# Patient Record
Sex: Female | Born: 1935 | Race: White | Hispanic: No | Smoking: Former smoker
Health system: Southern US, Community
[De-identification: ages and names within clinical notes are randomized; demographics above are authoritative.]

## PROBLEM LIST (undated history)

## (undated) DIAGNOSIS — Z9289 Personal history of other medical treatment: Secondary | ICD-10-CM

## (undated) DIAGNOSIS — M810 Age-related osteoporosis without current pathological fracture: Secondary | ICD-10-CM

## (undated) HISTORY — DX: Age-related osteoporosis without current pathological fracture: M81.0

## (undated) HISTORY — PX: HERNIA REPAIR: SHX51

## (undated) HISTORY — DX: Personal history of other medical treatment: Z92.89

## (undated) HISTORY — PX: CATARACT EXTRACTION: SUR2

## (undated) HISTORY — PX: APPENDECTOMY: SHX54

---

## 2014-10-14 LAB — BASIC METABOLIC PANEL
BUN: 20 (ref 4–21)
CO2: 26 — AB (ref 13–22)
Chloride: 102 (ref 99–108)
Creatinine: 0.9 (ref 0.5–1.1)
Glucose: 92
Potassium: 4.6 (ref 3.4–5.3)
Sodium: 141 (ref 137–147)

## 2014-10-14 LAB — HEPATIC FUNCTION PANEL
ALT: 13 (ref 7–35)
AST: 21 (ref 13–35)
Alkaline Phosphatase: 61 (ref 25–125)
Bilirubin, Direct: 0.1 (ref 0.01–0.4)

## 2014-10-14 LAB — CBC AND DIFFERENTIAL
HCT: 37 (ref 36–46)
Hemoglobin: 12.1 (ref 12.0–16.0)
Platelets: 214 (ref 150–399)
WBC: 4.4

## 2014-10-14 LAB — COMPREHENSIVE METABOLIC PANEL
Albumin: 4.8 (ref 3.5–5.0)
Calcium: 9.9 (ref 8.7–10.7)
GFR calc Af Amer: 59
GFR calc non Af Amer: 68
Globulin: 2.3

## 2014-10-14 LAB — CBC: RBC: 4.08 (ref 3.87–5.11)

## 2014-10-14 LAB — LIPID PANEL
HDL: 94 — AB (ref 35–70)
LDL Cholesterol: 92
Triglycerides: 47 (ref 40–160)

## 2015-09-27 LAB — COMPREHENSIVE METABOLIC PANEL
Albumin: 4.7 (ref 3.5–5.0)
Calcium: 10.1 (ref 8.7–10.7)
GFR calc Af Amer: 61
GFR calc non Af Amer: 53
Globulin: 2.4

## 2015-09-27 LAB — CBC AND DIFFERENTIAL
HCT: 37 (ref 36–46)
Hemoglobin: 12.5 (ref 12.0–16.0)
Platelets: 223 (ref 150–399)
WBC: 4.6

## 2015-09-27 LAB — LIPID PANEL
Cholesterol: 194 (ref 0–200)
HDL: 92 — AB (ref 35–70)
LDL Cholesterol: 89
Triglycerides: 64 (ref 40–160)

## 2015-09-27 LAB — BASIC METABOLIC PANEL
BUN: 24 — AB (ref 4–21)
CO2: 29 — AB (ref 13–22)
Chloride: 103 (ref 99–108)
Creatinine: 1 (ref 0.5–1.1)
Glucose: 86
Potassium: 4.4 (ref 3.4–5.3)
Sodium: 141 (ref 137–147)

## 2015-09-27 LAB — HEPATIC FUNCTION PANEL
ALT: 15 (ref 7–35)
AST: 23 (ref 13–35)
Alkaline Phosphatase: 70 (ref 25–125)
Bilirubin, Direct: 0.1 (ref 0.01–0.4)
Bilirubin, Total: 0.5

## 2015-09-27 LAB — CBC: RBC: 4.18 (ref 3.87–5.11)

## 2016-10-09 LAB — BASIC METABOLIC PANEL
BUN: 19 (ref 4–21)
CO2: 26 — AB (ref 13–22)
Chloride: 96 — AB (ref 99–108)
Creatinine: 1.1 (ref 0.5–1.1)
Glucose: 35
Potassium: 4 (ref 3.4–5.3)
Sodium: 139 (ref 137–147)

## 2016-10-09 LAB — LIPID PANEL
Cholesterol: 210 — AB (ref 0–200)
HDL: 106 — AB (ref 35–70)
LDL Cholesterol: 88
Triglycerides: 72 (ref 40–160)

## 2016-10-09 LAB — COMPREHENSIVE METABOLIC PANEL
Albumin: 4.9 (ref 3.5–5.0)
Calcium: 9.5 (ref 8.7–10.7)
GFR calc Af Amer: 55
GFR calc non Af Amer: 47
Globulin: 2.8

## 2016-10-09 LAB — HEPATIC FUNCTION PANEL
ALT: 12 (ref 7–35)
AST: 20 (ref 13–35)
Alkaline Phosphatase: 64 (ref 25–125)
Bilirubin, Direct: 0.2 (ref 0.01–0.4)
Bilirubin, Total: 0.7

## 2017-09-27 LAB — COMPREHENSIVE METABOLIC PANEL
Albumin: 4.5 (ref 3.5–5.0)
Calcium: 10.3 (ref 8.7–10.7)
Globulin: 4

## 2017-09-27 LAB — HEPATIC FUNCTION PANEL
ALT: 15 (ref 7–35)
AST: 28 (ref 13–35)
Alkaline Phosphatase: 62 (ref 25–125)
Bilirubin, Direct: 0.2 (ref 0.01–0.4)
Bilirubin, Total: 0.4

## 2017-09-27 LAB — LIPID PANEL
Cholesterol: 194 (ref 0–200)
HDL: 93 — AB (ref 35–70)
LDL Cholesterol: 85
Triglycerides: 78 (ref 40–160)

## 2017-09-27 LAB — BASIC METABOLIC PANEL
BUN: 24 — AB (ref 4–21)
CO2: 24 — AB (ref 13–22)
Chloride: 103 (ref 99–108)
Creatinine: 1 (ref 0.5–1.1)
Glucose: 105
Potassium: 4.6 (ref 3.4–5.3)
Sodium: 139 (ref 137–147)

## 2018-03-15 LAB — LIPID PANEL
Cholesterol: 204 — AB (ref 0–200)
HDL: 84 — AB (ref 35–70)
LDL Cholesterol: 95
Triglycerides: 123 (ref 40–160)

## 2018-03-15 LAB — COMPREHENSIVE METABOLIC PANEL
Albumin: 5 (ref 3.5–5.0)
Calcium: 10.9 — AB (ref 8.7–10.7)
Globulin: 3

## 2018-03-15 LAB — BASIC METABOLIC PANEL
BUN: 28 — AB (ref 4–21)
CO2: 27 — AB (ref 13–22)
Chloride: 102 (ref 99–108)
Creatinine: 1.2 — AB (ref 0.5–1.1)
Glucose: 105
Potassium: 4.3 (ref 3.4–5.3)
Sodium: 140 (ref 137–147)

## 2018-03-15 LAB — HEPATIC FUNCTION PANEL
ALT: 12 (ref 7–35)
AST: 22 (ref 13–35)
Alkaline Phosphatase: 79 (ref 25–125)
Bilirubin, Direct: 0.2 (ref 0.01–0.4)
Bilirubin, Total: 0.4

## 2019-08-12 ENCOUNTER — Ambulatory Visit: Payer: Medicare Other | Attending: Internal Medicine

## 2019-08-12 DIAGNOSIS — Z23 Encounter for immunization: Secondary | ICD-10-CM

## 2019-08-12 NOTE — Progress Notes (Signed)
   Covid-19 Vaccination Clinic  Name:  Tyleigh Mahn    MRN: [id number] DOB: [birth year]  08/12/2019  Ms. Ruddy was observed post Covid-19 immunization for 15 minutes without incidence. She was provided with Vaccine Information Sheet and instruction to access the V-Safe system.   Ms. Jankovich was instructed to call 911 with any severe reactions post vaccine: Marland Kitchen Difficulty breathing  . Swelling of your face and throat  . A fast heartbeat  . A bad rash all over your body  . Dizziness and weakness    Immunizations Administered    Name Date Dose VIS Date Route   Pfizer COVID-19 Vaccine 08/12/2019  2:56 PM 0.3 mL 06/01/2019 Intramuscular   Manufacturer: [employer], [employer]   Lot: [id number]   NDC: [address]-3

## 2019-09-05 ENCOUNTER — Ambulatory Visit: Payer: Medicare Other | Attending: Internal Medicine

## 2019-09-05 DIAGNOSIS — Z23 Encounter for immunization: Secondary | ICD-10-CM

## 2019-09-05 NOTE — Progress Notes (Signed)
   Covid-19 Vaccination Clinic  Name:  Anna Ward    MRN: [id number] DOB: 1932-03-29  09/05/2019  Anna Ward was observed post Covid-19 immunization for 30 minutes based on pre-vaccination screening without incident. She was provided with Vaccine Information Sheet and instruction to access the V-Safe system.   Anna Ward was instructed to call 911 with any severe reactions post vaccine: Marland Kitchen Difficulty breathing  . Swelling of face and throat  . A fast heartbeat  . A bad rash all over body  . Dizziness and weakness   Immunizations Administered    Name Date Dose VIS Date Route   Pfizer COVID-19 Vaccine 09/05/2019 11:26 AM 0.3 mL 06/01/2019 Intramuscular   Manufacturer: [employer], [employer]   Lot: [id number]   NDC: [address]-4

## 2020-07-01 ENCOUNTER — Ambulatory Visit: Payer: Medicare Other | Attending: Internal Medicine

## 2020-07-01 DIAGNOSIS — Z23 Encounter for immunization: Secondary | ICD-10-CM

## 2020-07-01 NOTE — Progress Notes (Signed)
   Covid-19 Vaccination Clinic  Name:  [name]    MRN: [id number] DOB: 1931-08-26  07/01/2020  [name] was observed post Covid-19 immunization for 15 minutes without incident. She was provided with Vaccine Information Sheet and instruction to access the V-Safe system.   [name] was instructed to call 911 with any severe reactions post vaccine: Marland Kitchen Difficulty breathing  . Swelling of face and throat  . A fast heartbeat  . A bad rash all over body  . Dizziness and weakness   Immunizations Administered    Name Date Dose VIS Date Route   Pfizer COVID-19 Vaccine 07/01/2020  2:33 PM 0.3 mL 04/09/2020 Intramuscular   Manufacturer: [employer], [employer]   Lot: [id number]   NDC: [address]-3

## 2021-02-10 ENCOUNTER — Emergency Department (HOSPITAL_COMMUNITY): Payer: Medicare Other

## 2021-02-10 ENCOUNTER — Other Ambulatory Visit: Payer: Self-pay

## 2021-02-10 ENCOUNTER — Encounter (HOSPITAL_COMMUNITY): Payer: Self-pay | Admitting: Emergency Medicine

## 2021-02-10 ENCOUNTER — Emergency Department (HOSPITAL_COMMUNITY)
Admission: EM | Admit: 2021-02-10 | Discharge: 2021-02-10 | Disposition: A | Discharge status: Home / Self Care | Payer: Medicare Other | Attending: Emergency Medicine | Admitting: Emergency Medicine

## 2021-02-10 DIAGNOSIS — M545 Low back pain, unspecified: Secondary | ICD-10-CM

## 2021-02-10 DIAGNOSIS — S32010A Wedge compression fracture of first lumbar vertebra, initial encounter for closed fracture: Secondary | ICD-10-CM | POA: Diagnosis not present

## 2021-02-10 DIAGNOSIS — N200 Calculus of kidney: Secondary | ICD-10-CM | POA: Diagnosis not present

## 2021-02-10 DIAGNOSIS — S3210XA Unspecified fracture of sacrum, initial encounter for closed fracture: Secondary | ICD-10-CM | POA: Insufficient documentation

## 2021-02-10 DIAGNOSIS — W1839XA Other fall on same level, initial encounter: Secondary | ICD-10-CM | POA: Diagnosis not present

## 2021-02-10 DIAGNOSIS — Y9289 Other specified places as the place of occurrence of the external cause: Secondary | ICD-10-CM | POA: Diagnosis not present

## 2021-02-10 LAB — CBC WITH DIFFERENTIAL/PLATELET
Abs Immature Granulocytes: 0.02 10*3/uL (ref 0.00–0.07)
Basophils Absolute: 0 10*3/uL (ref 0.0–0.1)
Basophils Relative: 1 %
Eosinophils Absolute: 0.2 10*3/uL (ref 0.0–0.5)
Eosinophils Relative: 2 %
HCT: 32.5 % — ABNORMAL LOW (ref 36.0–46.0)
Hemoglobin: 10.7 g/dL — ABNORMAL LOW (ref 12.0–15.0)
Immature Granulocytes: 0 %
Lymphocytes Relative: 8 %
Lymphs Abs: 0.6 10*3/uL — ABNORMAL LOW (ref 0.7–4.0)
MCH: 29.8 pg (ref 26.0–34.0)
MCHC: 32.9 g/dL (ref 30.0–36.0)
MCV: 90.5 fL (ref 80.0–100.0)
Monocytes Absolute: 0.6 10*3/uL (ref 0.1–1.0)
Monocytes Relative: 8 %
Neutro Abs: 6 10*3/uL (ref 1.7–7.7)
Neutrophils Relative %: 81 %
Platelets: 295 10*3/uL (ref 150–400)
RBC: 3.59 MIL/uL — ABNORMAL LOW (ref 3.87–5.11)
RDW: 12.9 % (ref 11.5–15.5)
WBC: 7.4 10*3/uL (ref 4.0–10.5)
nRBC: 0 % (ref 0.0–0.2)

## 2021-02-10 LAB — BASIC METABOLIC PANEL
Anion gap: 11 (ref 5–15)
BUN: 30 mg/dL — ABNORMAL HIGH (ref 8–23)
CO2: 24 mmol/L (ref 22–32)
Calcium: 9.7 mg/dL (ref 8.9–10.3)
Chloride: 100 mmol/L (ref 98–111)
Creatinine, Ser: 1.4 mg/dL — ABNORMAL HIGH (ref 0.44–1.00)
GFR, Estimated: 37 mL/min — ABNORMAL LOW (ref >60)
Glucose, Bld: 116 mg/dL — ABNORMAL HIGH (ref 70–99)
Potassium: 5.1 mmol/L (ref 3.5–5.1)
Sodium: 135 mmol/L (ref 135–145)

## 2021-02-10 LAB — TROPONIN I (HIGH SENSITIVITY): Troponin I (High Sensitivity): 3 ng/L (ref <18)

## 2021-02-10 MED ORDER — MORPHINE SULFATE 15 MG PO TABS: 7.5000 mg | ORAL_TABLET | ORAL | 0 refills | Status: DC | PRN

## 2021-02-10 MED ORDER — DICLOFENAC SODIUM 1 % EX GEL: 4.0000 g | Freq: Four times a day (QID) | CUTANEOUS | 0 refills | Status: DC

## 2021-02-10 MED ORDER — ACETAMINOPHEN 500 MG PO TABS
1000.0000 mg | ORAL_TABLET | Freq: Once | ORAL | Status: AC
  Administered 2021-02-10: 1000 mg via ORAL
  Filled 2021-02-10: qty 2

## 2021-02-10 MED ORDER — OXYCODONE HCL 5 MG PO TABS
2.5000 mg | ORAL_TABLET | Freq: Once | ORAL | Status: AC
  Administered 2021-02-10: 2.5 mg via ORAL
  Filled 2021-02-10: qty 1

## 2021-02-10 NOTE — ED Provider Notes (Signed)
[facility]-EMERGENCY DEPT Provider Note   CSN: [id number] Arrival date & time: 02/10/21  3299     History Chief Complaint  Patient presents with   back pain    Anna Ward is a 87 y.o. female.  87 yo F with a chief complaints of low back pain.  This been going on for a couple days now.  The patient had a fall a couple weeks ago but did not experience any back pain until a couple days ago.  She denies radiation down the legs denies loss of bowel or bladder denies loss of peritoneal sensation.  She has had problems with her low back previously.  She denies any urinary symptoms denies abdominal pain.  Denies nausea vomiting or diarrhea.  Has been able to eat and drink without issue.  Couple weeks ago when she fell she said she was standing waiting to get on the elevator and she suddenly felt lightheaded and collapsed to the ground.  She thinks that she blacked out.  At that time she does think that she struck her head was not had any headaches confusion or vomiting.  Since then has not had any confusion.  Has not had any dizziness.  The history is provided by the patient.  Back Pain Location:  Lumbar spine Quality:  Aching Radiates to:  Does not radiate Pain severity:  Moderate Pain is:  Worse during the day Onset quality:  Sudden Duration:  2 days Timing:  Constant Progression:  Worsening Chronicity:  New Relieved by:  Nothing Worsened by:  Nothing Ineffective treatments:  None tried Associated symptoms: chest pain   Associated symptoms: no dysuria, no fever and no headaches       History reviewed. No pertinent past medical history.  There are no problems to display for this patient.   History reviewed. No pertinent surgical history.   OB History   No obstetric history on file.     No family history on file.     Home Medications Prior to Admission medications   Medication Sig Start Date End Date Taking? Authorizing Provider  acetaminophen  (TYLENOL) 500 MG tablet Take 1,000 mg by mouth every 6 (six) hours as needed for mild pain.   Yes [provider]  Ascorbic Acid (VITAMIN C PO) Take 1 tablet by mouth daily.   Yes [provider]  CALCIUM PO Take 1 tablet by mouth daily.   Yes [provider]  Carboxymethylcellulose Sodium (EYE DROPS OP) Place 1 drop into both eyes daily as needed (dry eyes).   Yes [provider]  diclofenac Sodium (VOLTAREN) 1 % GEL Apply 4 g topically 4 (four) times daily. 02/10/21  Yes Melene Plan, DO  Ibuprofen-diphenhydrAMINE Cit (ADVIL PM) 200-38 MG TABS Take 1 tablet by mouth at bedtime as needed (sleep).   Yes [provider]  morphine (MSIR) 15 MG tablet Take 0.5 tablets (7.5 mg total) by mouth every 4 (four) hours as needed for severe pain. 02/10/21  Yes Melene Plan, DO  Multiple Vitamin (MULTIVITAMIN) tablet Take 1 tablet by mouth daily.   Yes [provider]    Allergies    Patient has no allergy information on record.  Review of Systems   Review of Systems  Constitutional:  Negative for chills and fever.  HENT:  Negative for congestion and rhinorrhea.   Eyes:  Negative for redness and visual disturbance.  Respiratory:  Negative for shortness of breath and wheezing.   Cardiovascular:  Positive for  chest pain. Negative for palpitations.  Gastrointestinal:  Negative for nausea and vomiting.  Genitourinary:  Negative for dysuria and urgency.  Musculoskeletal:  Positive for back pain. Negative for arthralgias and myalgias.  Skin:  Negative for pallor and wound.  Neurological:  Negative for dizziness and headaches.   Physical Exam Updated Vital Signs BP (!) 142/77   Pulse 83   Temp 97.9 F (36.6 C) (Oral)   Resp 16   SpO2 98%   Physical Exam Vitals and nursing note reviewed.  Constitutional:      General: She is not in acute distress.    Appearance: She is well-developed. She is not diaphoretic.  HENT:     Head: Normocephalic and  atraumatic.  Eyes:     Pupils: Pupils are equal, round, and reactive to light.  Cardiovascular:     Rate and Rhythm: Normal rate and regular rhythm.     Heart sounds: No murmur heard.   No friction rub. No gallop.  Pulmonary:     Effort: Pulmonary effort is normal.     Breath sounds: No wheezing or rales.  Abdominal:     General: There is no distension.     Palpations: Abdomen is soft.     Tenderness: There is no abdominal tenderness.  Musculoskeletal:        General: No tenderness.     Cervical back: Normal range of motion and neck supple.     Comments: Patient complains of pain to the SI joint area bilaterally.  No midline spinal tenderness step-offs or deformities.  Pulse motor and sensation intact bilateral lower extremities.  No clonus negative Babinski reflexes are 2+ and equal.  Skin:    General: Skin is warm and dry.  Neurological:     Mental Status: She is alert and oriented to person, place, and time.  Psychiatric:        Behavior: Behavior normal.    ED Results / Procedures / Treatments   Labs (all labs ordered are listed, but only abnormal results are displayed) Labs Reviewed  CBC WITH DIFFERENTIAL/PLATELET - Abnormal; Notable for the following components:      Result Value   RBC 3.59 (*)    Hemoglobin 10.7 (*)    HCT 32.5 (*)    Lymphs Abs 0.6 (*)    All other components within normal limits  BASIC METABOLIC PANEL - Abnormal; Notable for the following components:   Glucose, Bld 116 (*)    BUN 30 (*)    Creatinine, Ser 1.40 (*)    GFR, Estimated 37 (*)    All other components within normal limits  TROPONIN I (HIGH SENSITIVITY)    EKG EKG Interpretation  Date/Time:  Tuesday February 10 2021 08:40:45 EDT Ventricular Rate:  86 PR Interval:  205 QRS Duration: 79 QT Interval:  384 QTC Calculation: 460 R Axis:   64 Text Interpretation: Sinus rhythm No old tracing to compare Confirmed by Melene PlanFloyd, Millie Forde [phone]([id number]) on 02/10/2021 9:50:19 AM  Radiology CT L-SPINE NO  CHARGE  Result Date: 02/10/2021 CLINICAL DATA:  Bilateral low back pain, recent fall EXAM: CT Lumbar Spine without contrast TECHNIQUE: Technique: Multiplanar CT images of the lumbar spine were reconstructed from contemporary CT of the Abdomen and Pelvis. CONTRAST:  None COMPARISON:  None FINDINGS: Segmentation: 5 lumbar type vertebrae. Alignment: Mild retrolisthesis at L1-L2. Vertebrae: Compression fractures are present at T12, L1, L3, and L4. These are age-indeterminate but favored to be non-acute. Height loss is greatest at T12. There is mild  osseous retropulsion, greatest at T12 and L1. Decreased osseous mineralization. Paraspinal and other soft tissues: Unremarkable. Disc levels: Multilevel degenerative changes are present with disc space narrowing, endplate osteophytes, and facet hypertrophy with ligamentum flavum thickening. Mild canal stenosis at T11-T12 secondary to endplate retropulsion and disc bulge with facet hypertrophy. Marked canal stenosis at L4-L5 where there is a disc bulge, endplate osteophytic ridging, and facet hypertrophy with ligamentum flavum thickening. Multilevel foraminal narrowing is greatest on the left at L4-L5. IMPRESSION: Age-indeterminate but most likely chronic T12, L1, L3, and L4 compression fractures. Multilevel degenerative changes. Canal and foraminal stenosis are greatest at L4-L5. Electronically Signed   By: Guadlupe Spanish M.D.   On: 02/10/2021 11:59   CT Renal Stone Study  Result Date: 02/10/2021 CLINICAL DATA:  Flank pain, bilateral low back pain status post fall EXAM: CT ABDOMEN AND PELVIS WITHOUT CONTRAST TECHNIQUE: Multidetector CT imaging of the abdomen and pelvis was performed following the standard protocol without IV contrast. COMPARISON:  None. FINDINGS: Lower chest: No acute abnormality. Hepatobiliary: No focal liver abnormality is seen. No gallstones, gallbladder wall thickening, or biliary dilatation. Pancreas: Unremarkable. No pancreatic ductal dilatation  or surrounding inflammatory changes. Spleen: No splenic mass. Dystrophic calcifications within the spleen likely reflecting prior granulomatous disease. Adrenals/Urinary Tract: Normal adrenal glands. 3 mm nonobstructing right renal calculus. No other nephrolithiasis. No ureterolithiasis. No renal mass or obstructive uropathy. Distended bladder. Stomach/Bowel: Stomach is within normal limits. No evidence of bowel wall thickening, distention, or inflammatory changes. Small hiatal hernia. Diverticulosis without evidence of diverticulitis. Vascular/Lymphatic: Normal caliber abdominal aorta with mild atherosclerosis. No lymphadenopathy. Reproductive: Uterus and bilateral adnexa are unremarkable. Other: No abdominal wall hernia or abnormality. No abdominopelvic ascites. Musculoskeletal: Generalized osteopenia. No aggressive osseous lesion. Age indeterminate comminuted T12 vertebral body compression fracture with approximately 90% central height loss and 2 mm of retropulsion of the superior posterior margin. Age-indeterminate L1 vertebral body compression fracture with approximately 50% central height loss and a subtle linear lucency (image 82/series 6) concerning for an acute fracture. Age-indeterminate L3 vertebral body compression fracture with approximately 30% height loss. Age-indeterminate L4 vertebral body compression fracture with approximately 60% central height loss. Degenerative disease with disc height loss at T11-12, T12-L1, L3-4 and L5-S1. Severe spinal stenosis at L4-5. Severe bilateral facet arthropathy at L4-5 and L5-S1. Bilateral foraminal stenosis at L5-S1. Nondisplaced lower sacral fracture involving the S4 vertebral body. IMPRESSION: 1. Nonobstructing 3 mm right renal calculus. 2. Age-indeterminate L1 vertebral body compression fracture with approximately 50% central height loss and a subtle linear lucency (image 82/series 6) concerning for an acute fracture. 3. Age-indeterminate compression fractures  of the T12, L3 and L4 vertebral bodies. 4. Acute nondisplaced lower sacral fracture involving the S4 vertebral body. 5.  Aortic Atherosclerosis (ICD10-I70.0). Electronically Signed   By: Elige Ko M.D.   On: 02/10/2021 12:22    Procedures Procedures   Medications Ordered in ED Medications  acetaminophen (TYLENOL) tablet 1,000 mg (1,000 mg Oral Given 02/10/21 0944)  oxyCODONE (Oxy IR/ROXICODONE) immediate release tablet 2.5 mg (2.5 mg Oral Given 02/10/21 0944)    ED Course  I have reviewed the triage vital signs and the nursing notes.  Pertinent labs & imaging results that were available during my care of the patient were reviewed by me and considered in my medical decision making (see chart for details).    MDM Rules/Calculators/A&P  87 yo F with a chief complaints of low back pain.  Bilateral in nature occurred a couple weeks after having a fall.  Going on for the past couple days.  The patient has had back pain like this before.  Rates it a 9 out of 10.  We will obtain a CT scan to evaluate for possible fracture versus intra-abdominal pathology.  Blood work if the patient has been feeling lightheaded off and on though not in the past couple weeks.  She on review of systems states that she has been having some pinpoint right-sided chest pain that seems to come and go and not having pain currently.  We will obtain a single troponin.  Treat pain.  Reassess.  Troponin negative.  Blood work otherwise unremarkable.  CT scan without intra-abdominal pathology.  Patient found to possibly have a new L1 compression fracture, as sort of compression fracture.  Will place in a lumbar.  All neurosurgery follow-up.  Pain significantly improved with medications here.  1:09 PM:  I have discussed the diagnosis/risks/treatment options with the patient and believe the pt to be eligible for discharge home to follow-up with PCP. We also discussed returning to the ED immediately if new  or worsening sx occur. We discussed the sx which are most concerning (e.g., sudden worsening pain, fever, inability to tolerate by mouth) that necessitate immediate return. Medications administered to the patient during their visit and any new prescriptions provided to the patient are listed below.  Medications given during this visit Medications  acetaminophen (TYLENOL) tablet 1,000 mg (1,000 mg Oral Given 02/10/21 0944)  oxyCODONE (Oxy IR/ROXICODONE) immediate release tablet 2.5 mg (2.5 mg Oral Given 02/10/21 0944)     The patient appears reasonably screen and/or stabilized for discharge and I doubt any other medical condition or other [facility] requiring further screening, evaluation, or treatment in the ED at this time prior to discharge.   Final Clinical Impression(s) / ED Diagnoses Final diagnoses:  Low back pain  Closed wedge compression fracture of L1 vertebra, initial encounter (HCC)  Closed fracture of sacrum, unspecified portion of sacrum, initial encounter [facility])    Rx / DC Orders ED Discharge Orders          Ordered    morphine (MSIR) 15 MG tablet  Every 4 hours PRN        02/10/21 1303    diclofenac Sodium (VOLTAREN) 1 % GEL  4 times daily        02/10/21 1303             Melene Plan, DO 02/10/21 1309

## 2021-02-10 NOTE — Discharge Instructions (Addendum)
Follow up with neurosurgery in the office.  Return for uncontrolled pain, concern for safety at home.  Use the gel as prescribed.   Also take tylenol 1000mg (2 extra strength) four times a day.   Then take the pain medicine if you feel like you need it. Narcotics do not help with the pain, they only make you care about it less.  You can become addicted to this, people may break into your house to steal it.  It will constipate you.  If you drive under the influence of this medicine you can get a DUI.

## 2021-02-10 NOTE — ED Notes (Signed)
Ortho contacted, per ortho equipment needs to be brought in.  May take 3 hours to obtain.

## 2021-02-10 NOTE — ED Triage Notes (Signed)
Pt reports having a fall 2 weeks ago and is now having back pain. Pt reports not being able to sleep well because of the lower back pain.

## 2021-02-10 NOTE — Progress Notes (Signed)
Orthopedic Tech Progress Note Patient Details:  Anna Ward 04-06-1932 [id number]         Saul Fordyce 02/10/2021, 12:40 PMCalled and routed Lumbar sacral support and lumbar corset to [facility].

## 2021-02-10 NOTE — ED Notes (Signed)
Pt reports having dizziness prior to a fall that caused pain pt is experiencing now.  Pt placed on 12 lead, EKG captured can be exported should the MD feel necessary.

## 2021-02-16 ENCOUNTER — Ambulatory Visit: Payer: Medicare Other | Admitting: Adult Health

## 2021-02-16 ENCOUNTER — Other Ambulatory Visit: Payer: Self-pay

## 2021-02-16 VITALS — BP 152/94 | HR 96 | Temp 97.1°F | Wt 99.4 lb

## 2021-02-16 DIAGNOSIS — M545 Low back pain, unspecified: Secondary | ICD-10-CM | POA: Diagnosis not present

## 2021-02-16 DIAGNOSIS — K5901 Slow transit constipation: Secondary | ICD-10-CM | POA: Diagnosis not present

## 2021-02-16 DIAGNOSIS — G8929 Other chronic pain: Secondary | ICD-10-CM

## 2021-02-16 MED ORDER — POLYETHYLENE GLYCOL 3350 17 G PO PACK: 17.0000 g | PACK | Freq: Every day | ORAL | 2 refills | Status: DC

## 2021-02-16 MED ORDER — OXYCODONE HCL 5 MG PO TABS: 2.5000 mg | ORAL_TABLET | Freq: Four times a day (QID) | ORAL | 0 refills | Status: DC | PRN

## 2021-02-16 NOTE — Progress Notes (Signed)
[facility]  Provider:  Kenard Gower DNP  Code Status:  Full Code  Goals of Care:  Advanced Directives 02/10/2021  Does Patient Have a Medical Advance Directive? No  Would patient like information on creating a medical advance directive? No - Patient declined     Chief Complaint  Patient presents with   Acute Visit    Patient presents today for a post fall that occurred 2 weeks ago. She reports pain in lower back/ tailbone.    HPI: Patient is a 87 y.o. female seen today for an acute visit for low back pain. She was seen in the ED on 02/10/21 for low back pain. She fell 2 weeks prior to ED visit. Pain does not radiate to legs nor loss of bowel or bladder sensation. CT Lumbar Spine without contrast showed age-indeterminate but most likely chronic T12, L1, L3 and L4 compression fractures.. Multilevel degenerative changes. Canal and foraminal stenosis are greatest at L4-L5. She was treated with Acetaminophen, Oxycodone 2.5 mg and lumbar brace. She was discharged with MSIR 7.5 mg every 4 hours PRN.   She was seen walking with 2 sticks. Son came to the clinic with her. She stated that she has taken the 4 tablets of MSIR 7.5 mg that was prescribed on 8/23, when she went to the ED. She stated that sometimes, it is hard to get up in the morning due to her lower back pains. She, also, complained of constipation.  No past medical history on file.  No past surgical history on file.  No Known Allergies  Outpatient Encounter Medications as of 02/16/2021  Medication Sig   acetaminophen (TYLENOL) 500 MG tablet Take 1,000 mg by mouth every 6 (six) hours as needed for mild pain.   Ascorbic Acid (VITAMIN C PO) Take 1 tablet by mouth daily.   CALCIUM PO Take 1 tablet by mouth daily.   Carboxymethylcellulose Sodium (EYE DROPS OP) Place 1 drop into both eyes daily as needed (dry eyes).   diclofenac Sodium (VOLTAREN) 1 % GEL Apply 4 g topically 4 (four) times daily.   Ibuprofen-diphenhydrAMINE  Cit (ADVIL PM) 200-38 MG TABS Take 1 tablet by mouth at bedtime as needed (sleep).   Multiple Vitamin (MULTIVITAMIN) tablet Take 1 tablet by mouth daily.   oxyCODONE (OXY IR/ROXICODONE) 5 MG immediate release tablet Take 0.5 tablets (2.5 mg total) by mouth every 6 (six) hours as needed for severe pain.   polyethylene glycol (MIRALAX / GLYCOLAX) 17 g packet Take 17 g by mouth daily.   [DISCONTINUED] morphine (MSIR) 15 MG tablet Take 0.5 tablets (7.5 mg total) by mouth every 4 (four) hours as needed for severe pain.   No facility-administered encounter medications on file as of 02/16/2021.    Review of Systems:  Review of Systems  Constitutional:  Positive for activity change. Negative for appetite change.       Not walking that much after the fall  HENT: Negative.  Negative for congestion and sore throat.        Bilateral hearing aids  Eyes: Negative.   Respiratory:  Negative for cough and shortness of breath.   Gastrointestinal:  Positive for constipation.  Endocrine: Negative.   Genitourinary:  Negative for difficulty urinating.  Musculoskeletal:  Positive for back pain. Negative for joint swelling.  Skin: Negative.   Neurological:  Positive for numbness. Negative for dizziness and headaches.  Psychiatric/Behavioral: Negative.     Health Maintenance  Topic Date Due   TETANUS/TDAP  Never done  Zoster Vaccines- Shingrix (1 of 2) Never done   DEXA SCAN  Never done   PNA vac Low Risk Adult (1 of 2 - PCV13) Never done   COVID-19 Vaccine (4 - Booster for Pfizer series) 10/29/2020   INFLUENZA VACCINE  01/19/2021   HPV VACCINES  Aged Out    Physical Exam: Vitals:   02/16/21 1627  BP: (!) 152/94  Pulse: 96  Temp: (!) 97.1 F (36.2 C)  Weight: 99 lb 6.4 oz (45.1 kg)   There is no height or weight on file to calculate BMI. Physical Exam Constitutional:      Appearance: Normal appearance.  HENT:     Head: Normocephalic and atraumatic.     Nose: Nose normal.  Eyes:      Conjunctiva/sclera: Conjunctivae normal.  Cardiovascular:     Rate and Rhythm: Normal rate and regular rhythm.  Pulmonary:     Effort: Pulmonary effort is normal.     Breath sounds: Normal breath sounds.  Abdominal:     Palpations: Abdomen is soft.  Musculoskeletal:        General: Normal range of motion.     Cervical back: Normal range of motion and neck supple. No rigidity or tenderness.     Right lower leg: No edema.     Left lower leg: No edema.  Skin:    General: Skin is warm and dry.  Neurological:     Mental Status: She is alert and oriented to person, place, and time. Mental status is at baseline.  Psychiatric:        Mood and Affect: Mood normal.        Behavior: Behavior normal.        Thought Content: Thought content normal.        Judgment: Judgment normal.    Labs reviewed: Basic Metabolic Panel: Recent Labs    02/10/21 0953  NA 135  K 5.1  CL 100  CO2 24  GLUCOSE 116*  BUN 30*  CREATININE 1.40*  CALCIUM 9.7   CBC: Recent Labs    02/10/21 0953  WBC 7.4  NEUTROABS 6.0  HGB 10.7*  HCT 32.5*  MCV 90.5  PLT 295   Lipid Panel: No results for input(s): CHOL, HDL, LDLCALC, TRIG, CHOLHDL, LDLDIRECT in the last 8760 hours. No results found for: HGBA1C  Procedures since last visit: CT L-SPINE NO CHARGE  Result Date: 02/10/2021 CLINICAL DATA:  Bilateral low back pain, recent fall EXAM: CT Lumbar Spine without contrast TECHNIQUE: Technique: Multiplanar CT images of the lumbar spine were reconstructed from contemporary CT of the Abdomen and Pelvis. CONTRAST:  None COMPARISON:  None FINDINGS: Segmentation: 5 lumbar type vertebrae. Alignment: Mild retrolisthesis at L1-L2. Vertebrae: Compression fractures are present at T12, L1, L3, and L4. These are age-indeterminate but favored to be non-acute. Height loss is greatest at T12. There is mild osseous retropulsion, greatest at T12 and L1. Decreased osseous mineralization. Paraspinal and other soft tissues:  Unremarkable. Disc levels: Multilevel degenerative changes are present with disc space narrowing, endplate osteophytes, and facet hypertrophy with ligamentum flavum thickening. Mild canal stenosis at T11-T12 secondary to endplate retropulsion and disc bulge with facet hypertrophy. Marked canal stenosis at L4-L5 where there is a disc bulge, endplate osteophytic ridging, and facet hypertrophy with ligamentum flavum thickening. Multilevel foraminal narrowing is greatest on the left at L4-L5. IMPRESSION: Age-indeterminate but most likely chronic T12, L1, L3, and L4 compression fractures. Multilevel degenerative changes. Canal and foraminal stenosis are greatest at L4-L5. Electronically  Signed   By: Guadlupe Spanish M.D.   On: 02/10/2021 11:59   CT Renal Stone Study  Result Date: 02/10/2021 CLINICAL DATA:  Flank pain, bilateral low back pain status post fall EXAM: CT ABDOMEN AND PELVIS WITHOUT CONTRAST TECHNIQUE: Multidetector CT imaging of the abdomen and pelvis was performed following the standard protocol without IV contrast. COMPARISON:  None. FINDINGS: Lower chest: No acute abnormality. Hepatobiliary: No focal liver abnormality is seen. No gallstones, gallbladder wall thickening, or biliary dilatation. Pancreas: Unremarkable. No pancreatic ductal dilatation or surrounding inflammatory changes. Spleen: No splenic mass. Dystrophic calcifications within the spleen likely reflecting prior granulomatous disease. Adrenals/Urinary Tract: Normal adrenal glands. 3 mm nonobstructing right renal calculus. No other nephrolithiasis. No ureterolithiasis. No renal mass or obstructive uropathy. Distended bladder. Stomach/Bowel: Stomach is within normal limits. No evidence of bowel wall thickening, distention, or inflammatory changes. Small hiatal hernia. Diverticulosis without evidence of diverticulitis. Vascular/Lymphatic: Normal caliber abdominal aorta with mild atherosclerosis. No lymphadenopathy. Reproductive: Uterus and  bilateral adnexa are unremarkable. Other: No abdominal wall hernia or abnormality. No abdominopelvic ascites. Musculoskeletal: Generalized osteopenia. No aggressive osseous lesion. Age indeterminate comminuted T12 vertebral body compression fracture with approximately 90% central height loss and 2 mm of retropulsion of the superior posterior margin. Age-indeterminate L1 vertebral body compression fracture with approximately 50% central height loss and a subtle linear lucency (image 82/series 6) concerning for an acute fracture. Age-indeterminate L3 vertebral body compression fracture with approximately 30% height loss. Age-indeterminate L4 vertebral body compression fracture with approximately 60% central height loss. Degenerative disease with disc height loss at T11-12, T12-L1, L3-4 and L5-S1. Severe spinal stenosis at L4-5. Severe bilateral facet arthropathy at L4-5 and L5-S1. Bilateral foraminal stenosis at L5-S1. Nondisplaced lower sacral fracture involving the S4 vertebral body. IMPRESSION: 1. Nonobstructing 3 mm right renal calculus. 2. Age-indeterminate L1 vertebral body compression fracture with approximately 50% central height loss and a subtle linear lucency (image 82/series 6) concerning for an acute fracture. 3. Age-indeterminate compression fractures of the T12, L3 and L4 vertebral bodies. 4. Acute nondisplaced lower sacral fracture involving the S4 vertebral body. 5.  Aortic Atherosclerosis (ICD10-I70.0). Electronically Signed   By: Elige Ko M.D.   On: 02/10/2021 12:22    Assessment/Plan  1. Chronic low back pain without sciatica, unspecified back pain laterality -  will discontinue MSIR -  continue Lumbar brace, Diclofenac gel 4 gm QID - oxyCODONE (OXY IR/ROXICODONE) 5 MG immediate release tablet; Take 0.5 tablets (2.5 mg total) by mouth every 6 (six) hours as needed for severe pain.  Dispense: 10 tablet; Refill: 0  2. Slow transit constipation - polyethylene glycol (MIRALAX / GLYCOLAX)  17 g packet; Take 17 g by mouth daily.  Dispense: 14 each; Refill: 2     Labs/tests ordered:  None   Next appt:  02/20/2021

## 2021-02-16 NOTE — Patient Instructions (Addendum)
[email or web address]">  Chronic Back Pain When back pain lasts longer than 3 months, it is called chronic back pain. The cause of your back pain may not be known. Some common causes include: Wear and tear (degenerative disease) of the bones, ligaments, or disks in your back. Inflammation and stiffness in your back (arthritis). People who have chronic back pain often go through certain periods in which the pain is more intense (flare-ups). Many people can learn to manage the pain with home care. Follow these instructions at home: Pay attention to any changes in your symptoms. Take these actions to help withyour pain: Managing pain and stiffness     If directed, apply ice to the painful area. Your health care provider may recommend applying ice during the first 24-48 hours after a flare-up begins. To do this: Put ice in a plastic bag. Place a towel between your skin and the bag. Leave the ice on for 20 minutes, 2-3 times per day. If directed, apply heat to the affected area as often as told by your health care provider. Use the heat source that your health care provider recommends, such as a moist heat pack or a heating pad. Place a towel between your skin and the heat source. Leave the heat on for 20-30 minutes. Remove the heat if your skin turns bright red. This is especially important if you are unable to feel pain, heat, or cold. You may have a greater risk of getting burned. Try soaking in a warm tub. Activity  Avoid bending and other activities that make the problem worse. Maintain a proper position when standing or sitting: When standing, keep your upper back and neck straight, with your shoulders pulled back. Avoid slouching. When sitting, keep your back straight and relax your shoulders. Do not round your shoulders or pull them backward. Do not sit or stand in one place for long periods of time. Take brief periods of rest throughout the day. This will reduce your  pain. Resting in a lying or standing position is usually better than sitting to rest. When you are resting for longer periods, mix in some mild activity or stretching between periods of rest. This will help to prevent stiffness and pain. Get regular exercise. Ask your health care provider what activities are safe for you. Do not lift anything that is heavier than 10 lb (4.5 kg), or the limit that you are told, until your health care provider says that it is safe. Always use proper lifting technique, which includes: Bending your knees. Keeping the load close to your body. Avoiding twisting. Sleep on a firm mattress in a comfortable position. Try lying on your side with your knees slightly bent. If you lie on your back, put a pillow under your knees.  Medicines Treatment may include medicines for pain and inflammation taken by mouth or applied to the skin, prescription pain medicine, or muscle relaxants. Take over-the-counter and prescription medicines only as told by your health care provider. Ask your health care provider if the medicine prescribed to you: Requires you to avoid driving or using machinery. Can cause constipation. You may need to take these actions to prevent or treat constipation: Drink enough fluid to keep your urine pale yellow. Take over-the-counter or prescription medicines. Eat foods that are high in fiber, such as beans, whole grains, and fresh fruits and vegetables. Limit foods that are high in fat and processed sugars, such as fried or sweet foods. General instructions Do not use any products that contain   nicotine or tobacco, such as cigarettes, e-cigarettes, and chewing tobacco. If you need help quitting, ask your health care provider. Keep all follow-up visits as told by your health care provider. This is important. Contact a health care provider if: You have pain that is not relieved with rest or medicine. Your pain gets worse, or you have new pain. You have a high  fever. You have rapid weight loss. You have trouble doing your normal activities. Get help right away if: You have weakness or numbness in one or both of your legs or feet. You have trouble controlling your bladder or your bowels. You have severe back pain and have any of the following: Nausea or vomiting. Pain in your abdomen. Shortness of breath or you faint. Summary Chronic back pain is back pain that lasts longer than 3 months. When a flare-up begins, apply ice to the painful area for the first 24-48 hours. Apply a moist heat pad or use a heating pad on the painful area as directed by your health care provider. When you are resting for longer periods, mix in some mild activity or stretching between periods of rest. This will help to prevent stiffness and pain. This information is not intended to replace advice given to you by your health care provider. Make sure you discuss any questions you have with your healthcare provider. Document Revised: 07/18/2019 Document Reviewed: 07/18/2019 Elsevier Patient Education  2022 Elsevier Inc.  

## 2021-02-20 ENCOUNTER — Encounter: Payer: Self-pay | Admitting: Nurse Practitioner

## 2021-02-20 ENCOUNTER — Ambulatory Visit: Payer: Medicare Other | Admitting: Nurse Practitioner

## 2021-02-20 ENCOUNTER — Other Ambulatory Visit: Payer: Self-pay

## 2021-02-20 VITALS — BP 160/82 | HR 102 | Temp 98.4°F | Resp 16 | Ht 62.0 in | Wt 98.0 lb

## 2021-02-20 DIAGNOSIS — R030 Elevated blood-pressure reading, without diagnosis of hypertension: Secondary | ICD-10-CM

## 2021-02-20 DIAGNOSIS — M159 Polyosteoarthritis, unspecified: Secondary | ICD-10-CM

## 2021-02-20 DIAGNOSIS — S32010D Wedge compression fracture of first lumbar vertebra, subsequent encounter for fracture with routine healing: Secondary | ICD-10-CM

## 2021-02-20 DIAGNOSIS — E2839 Other primary ovarian failure: Secondary | ICD-10-CM

## 2021-02-20 DIAGNOSIS — N3281 Overactive bladder: Secondary | ICD-10-CM

## 2021-02-20 DIAGNOSIS — M8949 Other hypertrophic osteoarthropathy, multiple sites: Secondary | ICD-10-CM

## 2021-02-20 DIAGNOSIS — Z23 Encounter for immunization: Secondary | ICD-10-CM | POA: Diagnosis not present

## 2021-02-20 DIAGNOSIS — K5903 Drug induced constipation: Secondary | ICD-10-CM

## 2021-02-20 DIAGNOSIS — M8000XA Age-related osteoporosis with current pathological fracture, unspecified site, initial encounter for fracture: Secondary | ICD-10-CM

## 2021-02-20 MED ORDER — OXYCODONE HCL 5 MG PO TABS: 5.0000 mg | ORAL_TABLET | Freq: Four times a day (QID) | ORAL | 0 refills | Status: DC | PRN

## 2021-02-20 NOTE — Patient Instructions (Addendum)
proper hydration and to avoid NSAIDS (Aleve, Advil, Motrin, Ibuprofen)   To take extra strength tylenol 500 mg 2 tablets every 8 hours while having pain  Can increase oxycodone to 5 mg every 6 hours as needed pain.   Can increase miralax 17 gm to twice daily as needed for constipation.   Continue to use rub as needed   Can use heat as needed - ( rub after heat)

## 2021-02-20 NOTE — Progress Notes (Signed)
Careteam: Patient Care Team: Sharon Seller, NP as PCP - General (Geriatric Medicine)  PLACE OF SERVICE:  [facility]  Advanced Directive information Does Patient Have a Medical Advance Directive?: Yes, Type of Advance Directive: Living will, Does patient want to make changes to medical advance directive?: No - Patient declined  No Known Allergies  Chief Complaint  Patient presents with   Establish Care    New Patient.     HPI: Patient is a 87 y.o. female to establish care.  Last PCP was in [place]. She has been here for 2 years but has not seen anyone routinely since.  Very independent.  Walks ~1 mile around park with poles.   She had a fall getting on the elevator a few weeks ago.  Pain is mentally effective her.  Safe pain management.  Son ?therapy to help get her through the pain.   She lives alone. Her son drives and run errands for her. She lives [place].   Reports she is in a lot of pain without medication. 8/10 pain today Reports she took half tablet of oxycodone with aleve this morning.she has been taking 1/2 tablet  every 2 hours to get her through the night.   Osteoarthritis- controlled. Uses tylenol PRN.  Osteoporosis- on calcium [phone] mg daily. Will take Vit D 1000 units daily.   Low back pain after fall- using Voltaren She was up 4 times during the night.    Review of Systems:  Review of Systems  Constitutional:  Negative for chills, fever and weight loss.  HENT:  Negative for tinnitus.   Respiratory:  Negative for cough, sputum production and shortness of breath.   Cardiovascular:  Negative for chest pain, palpitations and leg swelling.  Gastrointestinal:  Negative for abdominal pain, constipation, diarrhea and heartburn.  Genitourinary:  Positive for frequency. Negative for dysuria and urgency.  Musculoskeletal:  Negative for back pain, falls, joint pain and myalgias.  Skin: Negative.   Neurological:  Negative for dizziness and  headaches.  Psychiatric/Behavioral:  Negative for depression and memory loss. The patient does not have insomnia.    Past Medical History:  Diagnosis Date   History of bone density study    History of CT scan    Osteoporosis    Past Surgical History:  Procedure Laterality Date   APPENDECTOMY     CATARACT EXTRACTION     HERNIA REPAIR     Social History:   reports that she has quit smoking. Her smoking use included cigarettes. She has a 2.50 pack-year smoking history. She has never used smokeless tobacco. She reports current alcohol use of about 7.0 standard drinks per week. She reports that she does not use drugs.  Family History  Problem Relation Age of Onset   Pancreatic cancer Father     Medications: Patient's Medications  New Prescriptions   No medications on file  Previous Medications   ACETAMINOPHEN (TYLENOL) 500 MG TABLET    Take 1,000 mg by mouth every 6 (six) hours as needed for mild pain.   ASCORBIC ACID (VITAMIN C PO)    Take 1 tablet by mouth daily.   CALCIUM PO    Take 1 tablet by mouth daily.   CARBOXYMETHYLCELLULOSE SODIUM (EYE DROPS OP)    Place 1 drop into both eyes daily as needed (dry eyes).   DICLOFENAC SODIUM (VOLTAREN) 1 % GEL    Apply 4 g topically 4 (four) times daily.   IBUPROFEN-DIPHENHYDRAMINE CIT (ADVIL PM) 200-38  MG TABS    Take 1 tablet by mouth at bedtime as needed (sleep).   MULTIPLE VITAMIN (MULTIVITAMIN) TABLET    Take 1 tablet by mouth daily.   NAPROXEN SODIUM (ALEVE PO)    Take by mouth as needed.   POLYETHYLENE GLYCOL (MIRALAX / GLYCOLAX) 17 G PACKET    Take 17 g by mouth daily.  Modified Medications   Modified Medication Previous Medication   OXYCODONE (OXY IR/ROXICODONE) 5 MG IMMEDIATE RELEASE TABLET oxyCODONE (OXY IR/ROXICODONE) 5 MG immediate release tablet      Take 1 tablet (5 mg total) by mouth every 6 (six) hours as needed for severe pain.    Take 0.5 tablets (2.5 mg total) by mouth every 6 (six) hours as needed for severe pain.   Discontinued Medications   No medications on file    Physical Exam:  Vitals:   02/20/21 1124  BP: (!) 160/82  Pulse: (!) 102  Resp: 16  Temp: 98.4 F (36.9 C)  SpO2: 98%  Weight: 98 lb (44.5 kg)  Height: 5\' 2"  (1.575 m)   Body mass index is 17.92 kg/m. Wt Readings from Last 3 Encounters:  02/20/21 98 lb (44.5 kg)  02/16/21 99 lb 6.4 oz (45.1 kg)    Physical Exam Constitutional:      General: She is not in acute distress.    Appearance: She is well-developed. She is not diaphoretic.  HENT:     Head: Normocephalic and atraumatic.     Mouth/Throat:     Pharynx: No oropharyngeal exudate.  Eyes:     Conjunctiva/sclera: Conjunctivae normal.     Pupils: Pupils are equal, round, and reactive to light.  Cardiovascular:     Rate and Rhythm: Normal rate and regular rhythm.     Heart sounds: Normal heart sounds.  Pulmonary:     Effort: Pulmonary effort is normal.     Breath sounds: Normal breath sounds.  Abdominal:     General: Bowel sounds are normal.     Palpations: Abdomen is soft.  Musculoskeletal:     Cervical back: Normal range of motion and neck supple.       Back:     Right lower leg: No edema.     Left lower leg: No edema.  Skin:    General: Skin is warm and dry.  Neurological:     Mental Status: She is alert.  Psychiatric:        Mood and Affect: Mood normal.    Labs reviewed: Basic Metabolic Panel: Recent Labs    02/10/21 0953  NA 135  K 5.1  CL 100  CO2 24  GLUCOSE 116*  BUN 30*  CREATININE 1.40*  CALCIUM 9.7   Liver Function Tests: No results for input(s): AST, ALT, ALKPHOS, BILITOT, PROT, ALBUMIN in the last 8760 hours. No results for input(s): LIPASE, AMYLASE in the last 8760 hours. No results for input(s): AMMONIA in the last 8760 hours. CBC: Recent Labs    02/10/21 0953  WBC 7.4  NEUTROABS 6.0  HGB 10.7*  HCT 32.5*  MCV 90.5  PLT 295   Lipid Panel: No results for input(s): CHOL, HDL, LDLCALC, TRIG, CHOLHDL, LDLDIRECT in  the last 8760 hours. TSH: No results for input(s): TSH in the last 8760 hours. A1C: No results found for: HGBA1C   Assessment/Plan 1. Compression fracture of L1 vertebra with routine healing, subsequent encounter -significant pain noted. Stressed importance of taking pain medication as prescribed and risk of overdose taking more than  directed.  - Ambulatory referral to Neurosurgery - oxyCODONE (OXY IR/ROXICODONE) 5 MG immediate release tablet; Take 1 tablet (5 mg total) by mouth every 6 (six) hours as needed for severe pain.  Dispense: 30 tablet; Refill: 0 -tylenol 1000 mg by mouth every 8 hours for pain  2. Estrogen deficiency - DG Bone Density; Future   3. Osteoporosis with current pathological fracture, unspecified osteoporosis type, initial encounter -continues on calcium and vit d. Interested in medication for OP.  - DG Bone Density; Future at this time  4. Overactive bladder -ongoing, consider myrbetriq at follow up  5. Need for influenza vaccination - Flu vaccine HIGH DOSE PF (Fluzone High dose)  6. Elevated blood pressure reading -noted today. Likely related   7. Drug-induced constipation -can increase miralax to twice daily to control constipation due to oxycodone.  8. Primary osteoarthritis involving multiple joints -stable at this time.   Next appt: 6 weeks.  Janene Harvey. Biagio Borg  [facility] [phone]

## 2021-02-25 ENCOUNTER — Telehealth: Payer: Self-pay

## 2021-02-25 NOTE — Telephone Encounter (Signed)
Onalee Hua notified and stated that he will have the specialist fax [place] his note.  Fax number given.

## 2021-02-25 NOTE — Telephone Encounter (Signed)
Message left on clinical intake voicemail by patients son Onalee Hua:   Patient was recently seen by specialist, Dr. Claud Kelp (? Spelling) who has her on Oxycodone, 1 by mouth every 6 hours and tylenol intermittently.  Onalee Hua would like Jessica's opinion on this regimen and questions if Shanda Bumps will take over prescribing pain medication. Patient has 15 pills left.  Side note, Oxycodone 5 mg was last filled by Sharon Seller, NP on 02/20/2021, a treatment agreement was not signed at the time.  Please advise

## 2021-02-25 NOTE — Telephone Encounter (Signed)
Will review specialist note when it is sent to [place]

## 2021-02-26 ENCOUNTER — Telehealth: Payer: Self-pay | Admitting: *Deleted

## 2021-02-26 NOTE — Telephone Encounter (Signed)
Patient son, Anna Ward and stated that patient is about to run out of her Oxycodone and wonders if you will refill it. Stated that you were going to wait on records to review but she is about to run out.   Please advise.

## 2021-02-27 ENCOUNTER — Other Ambulatory Visit: Payer: Self-pay | Admitting: Family

## 2021-02-27 DIAGNOSIS — S32010D Wedge compression fracture of first lumbar vertebra, subsequent encounter for fracture with routine healing: Secondary | ICD-10-CM

## 2021-02-27 MED ORDER — OXYCODONE HCL 5 MG PO TABS: 5.0000 mg | ORAL_TABLET | Freq: Four times a day (QID) | ORAL | 0 refills | Status: DC | PRN

## 2021-02-27 MED ORDER — MIRABEGRON ER 25 MG PO TB24: 25.0000 mg | ORAL_TABLET | Freq: Every day | ORAL | 1 refills | Status: DC

## 2021-02-27 NOTE — Telephone Encounter (Signed)
Anna Ward did not call in a Rx for the Oxycodone it was a "No Print".  Please Advise.

## 2021-02-27 NOTE — Telephone Encounter (Signed)
Oxycodone recently started on 02/20/2021 too soon to refill  Start on vitamin D 1000 units one by mouth daily  Take Mybetriq 25 mg tablet one by mouth daily for over active then schedule follow up visit in 4 weeks to evaluate OAB

## 2021-02-27 NOTE — Telephone Encounter (Signed)
Anna Ward, son, called and stated that patient is doing better.   But stated that patient was up a lot last night with frequent urination. Son is thinking that it is coming from the Oxycodone. Stated that they googled it and it is a side effect.   2.   Stated that Shanda Bumps told them that she can   give her something for Over Active Bladder and wonders if you could give this.   3.   Also thought on taking Vitamin D for bones, Previous Dr. Was going to make that suggestion and wants to know if they need to do this.   4.   Can you prescribe her Oxycodone because she is about to run out.    Please Advise. (Forwarded to [employer] due to [place] out of office)

## 2021-02-27 NOTE — Telephone Encounter (Signed)
Called and spoke with Bella Kennedy at [facility] and Rx was last written on 02/20/2021. #30  Patient son stated that patient's last one is Sunday.   Please Advise.

## 2021-02-27 NOTE — Telephone Encounter (Signed)
Verify with pharmacy.PDMP indicates filled.

## 2021-02-27 NOTE — Telephone Encounter (Signed)
Patient son, Mardelle Matte, notified and agreed. Stated that they have a follow up appointment scheduled for 10/17 and will follow up then.   Rx sent to pharmacy.   OXYCODONE message-son stated that patient was prescribed the Rx on 8/29. Stated that they would like to wean patient off of it. Stated that her last pill is Sunday. Wonders if a Rx could be called in for patient to take 1/2 tablet because if it is your medical opinion he would like to wean her down.   Please advise.

## 2021-02-27 NOTE — Telephone Encounter (Signed)
Will Refill for more 30 tablet

## 2021-04-06 ENCOUNTER — Ambulatory Visit: Payer: Medicare Other | Admitting: Nurse Practitioner

## 2021-04-23 ENCOUNTER — Telehealth: Payer: Self-pay | Admitting: Nurse Practitioner

## 2021-04-23 NOTE — Telephone Encounter (Signed)
I called patient and left a vm to call us back to reschedule their missed appt on 04/06/21

## 2021-06-12 ENCOUNTER — Telehealth: Payer: Self-pay | Admitting: Nurse Practitioner

## 2021-06-12 NOTE — Telephone Encounter (Signed)
Called pt to reschedule 6 week follow up appt that was canceled

## 2021-12-14 DIAGNOSIS — Z800 Family history of malignant neoplasm of digestive organs: Secondary | ICD-10-CM

## 2021-12-14 DIAGNOSIS — S72009A Fracture of unspecified part of neck of unspecified femur, initial encounter for closed fracture: Secondary | ICD-10-CM | POA: Diagnosis not present

## 2021-12-14 DIAGNOSIS — D62 Acute posthemorrhagic anemia: Secondary | ICD-10-CM | POA: Diagnosis not present

## 2021-12-14 DIAGNOSIS — Z66 Do not resuscitate: Secondary | ICD-10-CM | POA: Diagnosis present

## 2021-12-14 DIAGNOSIS — M80052A Age-related osteoporosis with current pathological fracture, left femur, initial encounter for fracture: Secondary | ICD-10-CM | POA: Diagnosis not present

## 2021-12-14 DIAGNOSIS — W1830XA Fall on same level, unspecified, initial encounter: Secondary | ICD-10-CM | POA: Diagnosis present

## 2021-12-14 DIAGNOSIS — Y92120 Kitchen in nursing home as the place of occurrence of the external cause: Secondary | ICD-10-CM

## 2021-12-14 DIAGNOSIS — D72829 Elevated white blood cell count, unspecified: Secondary | ICD-10-CM | POA: Diagnosis present

## 2021-12-14 DIAGNOSIS — N179 Acute kidney failure, unspecified: Secondary | ICD-10-CM | POA: Diagnosis not present

## 2021-12-14 DIAGNOSIS — Z87891 Personal history of nicotine dependence: Secondary | ICD-10-CM

## 2021-12-14 DIAGNOSIS — E440 Moderate protein-calorie malnutrition: Secondary | ICD-10-CM | POA: Diagnosis present

## 2021-12-14 DIAGNOSIS — M545 Low back pain, unspecified: Secondary | ICD-10-CM | POA: Diagnosis present

## 2021-12-14 DIAGNOSIS — Z681 Body mass index (BMI) 19 or less, adult: Secondary | ICD-10-CM

## 2021-12-14 DIAGNOSIS — H9190 Unspecified hearing loss, unspecified ear: Secondary | ICD-10-CM | POA: Diagnosis present

## 2021-12-14 DIAGNOSIS — J9811 Atelectasis: Secondary | ICD-10-CM | POA: Diagnosis present

## 2021-12-14 DIAGNOSIS — G8929 Other chronic pain: Secondary | ICD-10-CM | POA: Diagnosis present

## 2021-12-15 ENCOUNTER — Inpatient Hospital Stay (HOSPITAL_COMMUNITY): Payer: Medicare Other | Admitting: Anesthesiology

## 2021-12-15 ENCOUNTER — Inpatient Hospital Stay (HOSPITAL_COMMUNITY): Payer: Medicare Other

## 2021-12-15 ENCOUNTER — Emergency Department (HOSPITAL_COMMUNITY): Payer: Medicare Other

## 2021-12-15 ENCOUNTER — Encounter (HOSPITAL_COMMUNITY): Payer: Self-pay | Admitting: Emergency Medicine

## 2021-12-15 ENCOUNTER — Encounter (HOSPITAL_COMMUNITY)
Admission: EM | Disposition: A | Discharge status: Skilled Nursing Facility | Payer: Self-pay | Source: Home / Self Care | Attending: Internal Medicine

## 2021-12-15 ENCOUNTER — Other Ambulatory Visit: Payer: Self-pay

## 2021-12-15 ENCOUNTER — Inpatient Hospital Stay (HOSPITAL_COMMUNITY)
Admission: EM | Admit: 2021-12-15 | Discharge: 2021-12-18 | DRG: 481 | Disposition: A | Discharge status: Skilled Nursing Facility | Payer: Medicare Other | Attending: Internal Medicine | Admitting: Internal Medicine

## 2021-12-15 DIAGNOSIS — S72142A Displaced intertrochanteric fracture of left femur, initial encounter for closed fracture: Secondary | ICD-10-CM

## 2021-12-15 DIAGNOSIS — M80052A Age-related osteoporosis with current pathological fracture, left femur, initial encounter for fracture: Secondary | ICD-10-CM | POA: Diagnosis present

## 2021-12-15 DIAGNOSIS — Z800 Family history of malignant neoplasm of digestive organs: Secondary | ICD-10-CM | POA: Diagnosis not present

## 2021-12-15 DIAGNOSIS — W19XXXA Unspecified fall, initial encounter: Principal | ICD-10-CM

## 2021-12-15 DIAGNOSIS — H9190 Unspecified hearing loss, unspecified ear: Secondary | ICD-10-CM | POA: Diagnosis present

## 2021-12-15 DIAGNOSIS — J9811 Atelectasis: Secondary | ICD-10-CM | POA: Diagnosis present

## 2021-12-15 DIAGNOSIS — S72009A Fracture of unspecified part of neck of unspecified femur, initial encounter for closed fracture: Secondary | ICD-10-CM | POA: Diagnosis present

## 2021-12-15 DIAGNOSIS — D649 Anemia, unspecified: Secondary | ICD-10-CM

## 2021-12-15 DIAGNOSIS — D72829 Elevated white blood cell count, unspecified: Secondary | ICD-10-CM | POA: Diagnosis present

## 2021-12-15 DIAGNOSIS — Z66 Do not resuscitate: Secondary | ICD-10-CM | POA: Diagnosis present

## 2021-12-15 DIAGNOSIS — G8929 Other chronic pain: Secondary | ICD-10-CM | POA: Diagnosis present

## 2021-12-15 DIAGNOSIS — Z87891 Personal history of nicotine dependence: Secondary | ICD-10-CM | POA: Diagnosis not present

## 2021-12-15 DIAGNOSIS — S72002A Fracture of unspecified part of neck of left femur, initial encounter for closed fracture: Secondary | ICD-10-CM

## 2021-12-15 DIAGNOSIS — E440 Moderate protein-calorie malnutrition: Secondary | ICD-10-CM | POA: Diagnosis present

## 2021-12-15 DIAGNOSIS — N179 Acute kidney failure, unspecified: Secondary | ICD-10-CM | POA: Diagnosis not present

## 2021-12-15 DIAGNOSIS — Y92120 Kitchen in nursing home as the place of occurrence of the external cause: Secondary | ICD-10-CM | POA: Diagnosis not present

## 2021-12-15 DIAGNOSIS — D62 Acute posthemorrhagic anemia: Secondary | ICD-10-CM | POA: Diagnosis not present

## 2021-12-15 DIAGNOSIS — W1830XA Fall on same level, unspecified, initial encounter: Secondary | ICD-10-CM | POA: Diagnosis present

## 2021-12-15 DIAGNOSIS — Z681 Body mass index (BMI) 19 or less, adult: Secondary | ICD-10-CM | POA: Diagnosis not present

## 2021-12-15 DIAGNOSIS — N1831 Chronic kidney disease, stage 3a: Secondary | ICD-10-CM

## 2021-12-15 DIAGNOSIS — N289 Disorder of kidney and ureter, unspecified: Secondary | ICD-10-CM

## 2021-12-15 DIAGNOSIS — M545 Low back pain, unspecified: Secondary | ICD-10-CM | POA: Diagnosis present

## 2021-12-15 HISTORY — PX: FEMUR IM NAIL: SHX1597

## 2021-12-15 LAB — CBC WITH DIFFERENTIAL/PLATELET
Abs Immature Granulocytes: 0.05 10*3/uL (ref 0.00–0.07)
Basophils Absolute: 0 10*3/uL (ref 0.0–0.1)
Basophils Relative: 0 %
Eosinophils Absolute: 0.1 10*3/uL (ref 0.0–0.5)
Eosinophils Relative: 1 %
HCT: 28.3 % — ABNORMAL LOW (ref 36.0–46.0)
Hemoglobin: 9.3 g/dL — ABNORMAL LOW (ref 12.0–15.0)
Immature Granulocytes: 1 %
Lymphocytes Relative: 5 %
Lymphs Abs: 0.5 10*3/uL — ABNORMAL LOW (ref 0.7–4.0)
MCH: 31 pg (ref 26.0–34.0)
MCHC: 32.9 g/dL (ref 30.0–36.0)
MCV: 94.3 fL (ref 80.0–100.0)
Monocytes Absolute: 0.7 10*3/uL (ref 0.1–1.0)
Monocytes Relative: 6 %
Neutro Abs: 9.3 10*3/uL — ABNORMAL HIGH (ref 1.7–7.7)
Neutrophils Relative %: 87 %
Platelets: 229 10*3/uL (ref 150–400)
RBC: 3 MIL/uL — ABNORMAL LOW (ref 3.87–5.11)
RDW: 12.5 % (ref 11.5–15.5)
WBC: 10.7 10*3/uL — ABNORMAL HIGH (ref 4.0–10.5)
nRBC: 0 % (ref 0.0–0.2)

## 2021-12-15 LAB — I-STAT CHEM 8, ED
BUN: 40 mg/dL — ABNORMAL HIGH (ref 8–23)
Calcium, Ion: 1.23 mmol/L (ref 1.15–1.40)
Chloride: 106 mmol/L (ref 98–111)
Creatinine, Ser: 1.2 mg/dL — ABNORMAL HIGH (ref 0.44–1.00)
Glucose, Bld: 129 mg/dL — ABNORMAL HIGH (ref 70–99)
HCT: 29 % — ABNORMAL LOW (ref 36.0–46.0)
Hemoglobin: 9.9 g/dL — ABNORMAL LOW (ref 12.0–15.0)
Potassium: 4.6 mmol/L (ref 3.5–5.1)
Sodium: 139 mmol/L (ref 135–145)
TCO2: 27 mmol/L (ref 22–32)

## 2021-12-15 LAB — SURGICAL PCR SCREEN
MRSA, PCR: NEGATIVE
Staphylococcus aureus: NEGATIVE

## 2021-12-15 LAB — ABO/RH: ABO/RH(D): B POS

## 2021-12-15 SURGERY — INSERTION, INTRAMEDULLARY ROD, FEMUR
Anesthesia: General | Site: Hip | Laterality: Left

## 2021-12-15 MED ORDER — BUPIVACAINE-EPINEPHRINE 0.25% -1:200000 IJ SOLN
INTRAMUSCULAR | Status: AC
  Filled 2021-12-15: qty 1

## 2021-12-15 MED ORDER — EPHEDRINE SULFATE-NACL 50-0.9 MG/10ML-% IV SOSY
PREFILLED_SYRINGE | INTRAVENOUS | Status: DC | PRN
  Administered 2021-12-15 (×3): 5 mg via INTRAVENOUS

## 2021-12-15 MED ORDER — FENTANYL CITRATE (PF) 100 MCG/2ML IJ SOLN
INTRAMUSCULAR | Status: AC
  Filled 2021-12-15: qty 2

## 2021-12-15 MED ORDER — ONDANSETRON HCL 4 MG/2ML IJ SOLN
4.0000 mg | Freq: Once | INTRAMUSCULAR | Status: AC | PRN
  Administered 2021-12-15: 4 mg via INTRAVENOUS

## 2021-12-15 MED ORDER — TRANEXAMIC ACID-NACL 1000-0.7 MG/100ML-% IV SOLN
1000.0000 mg | INTRAVENOUS | Status: AC
  Administered 2021-12-15: 1000 mg via INTRAVENOUS
  Filled 2021-12-15: qty 100

## 2021-12-15 MED ORDER — ONDANSETRON HCL 4 MG/2ML IJ SOLN
4.0000 mg | Freq: Four times a day (QID) | INTRAMUSCULAR | Status: DC | PRN
  Administered 2021-12-16 (×2): 4 mg via INTRAVENOUS
  Filled 2021-12-15 (×2): qty 2

## 2021-12-15 MED ORDER — LACTATED RINGERS IV SOLN: INTRAVENOUS | Status: DC

## 2021-12-15 MED ORDER — HYDROCODONE-ACETAMINOPHEN 5-325 MG PO TABS: 1.0000 | ORAL_TABLET | ORAL | Status: DC | PRN

## 2021-12-15 MED ORDER — POVIDONE-IODINE 10 % EX SWAB
2.0000 | Freq: Once | CUTANEOUS | Status: AC
  Administered 2021-12-15: 2 via TOPICAL

## 2021-12-15 MED ORDER — PROPOFOL 10 MG/ML IV BOLUS
INTRAVENOUS | Status: DC | PRN
  Administered 2021-12-15: 70 mg via INTRAVENOUS

## 2021-12-15 MED ORDER — 0.9 % SODIUM CHLORIDE (POUR BTL) OPTIME
TOPICAL | Status: DC | PRN
  Administered 2021-12-15: 1000 mL

## 2021-12-15 MED ORDER — FENTANYL CITRATE (PF) 100 MCG/2ML IJ SOLN
INTRAMUSCULAR | Status: DC | PRN
  Administered 2021-12-15: 25 ug via INTRAVENOUS
  Administered 2021-12-15: 50 ug via INTRAVENOUS

## 2021-12-15 MED ORDER — FENTANYL CITRATE PF 50 MCG/ML IJ SOSY: 25.0000 ug | PREFILLED_SYRINGE | INTRAMUSCULAR | Status: DC | PRN

## 2021-12-15 MED ORDER — HYDROCODONE-ACETAMINOPHEN 7.5-325 MG PO TABS
1.0000 | ORAL_TABLET | ORAL | Status: DC | PRN
  Administered 2021-12-17 – 2021-12-18 (×3): 1 via ORAL
  Filled 2021-12-15 (×3): qty 1

## 2021-12-15 MED ORDER — CHLORHEXIDINE GLUCONATE 0.12 % MT SOLN
15.0000 mL | Freq: Once | OROMUCOSAL | Status: AC
  Administered 2021-12-15: 15 mL via OROMUCOSAL

## 2021-12-15 MED ORDER — ONDANSETRON HCL 4 MG/2ML IJ SOLN
INTRAMUSCULAR | Status: DC | PRN
  Administered 2021-12-15: 4 mg via INTRAVENOUS

## 2021-12-15 MED ORDER — TETRAHYDROZOLINE HCL 0.05 % OP SOLN: 1.0000 [drp] | Freq: Every day | OPHTHALMIC | Status: DC | PRN

## 2021-12-15 MED ORDER — OXYCODONE HCL 5 MG PO TABS: 5.0000 mg | ORAL_TABLET | Freq: Once | ORAL | Status: DC | PRN

## 2021-12-15 MED ORDER — ONDANSETRON HCL 4 MG/2ML IJ SOLN
INTRAMUSCULAR | Status: AC
  Filled 2021-12-15: qty 2

## 2021-12-15 MED ORDER — OXYCODONE HCL 5 MG/5ML PO SOLN: 5.0000 mg | Freq: Once | ORAL | Status: DC | PRN

## 2021-12-15 MED ORDER — ACETAMINOPHEN 500 MG PO TABS
500.0000 mg | ORAL_TABLET | Freq: Four times a day (QID) | ORAL | Status: AC
  Administered 2021-12-15 – 2021-12-16 (×2): 500 mg via ORAL
  Filled 2021-12-15 (×3): qty 1

## 2021-12-15 MED ORDER — BUPIVACAINE HCL (PF) 0.25 % IJ SOLN
INTRAMUSCULAR | Status: AC
  Filled 2021-12-15: qty 30

## 2021-12-15 MED ORDER — LIDOCAINE HCL (PF) 2 % IJ SOLN
INTRAMUSCULAR | Status: AC
  Filled 2021-12-15: qty 5

## 2021-12-15 MED ORDER — ISOPROPYL ALCOHOL 70 % SOLN
Status: DC | PRN
  Administered 2021-12-15: 1 via TOPICAL

## 2021-12-15 MED ORDER — VANCOMYCIN HCL 1000 MG IV SOLR
INTRAVENOUS | Status: DC | PRN
  Administered 2021-12-15: 1000 mg via TOPICAL

## 2021-12-15 MED ORDER — CEFAZOLIN SODIUM-DEXTROSE 2-4 GM/100ML-% IV SOLN
2.0000 g | INTRAVENOUS | Status: AC
  Administered 2021-12-15: 2 g via INTRAVENOUS
  Filled 2021-12-15: qty 100

## 2021-12-15 MED ORDER — ISOPROPYL ALCOHOL 70 % SOLN
Status: AC
  Filled 2021-12-15: qty 480

## 2021-12-15 MED ORDER — VANCOMYCIN HCL 1000 MG IV SOLR
INTRAVENOUS | Status: AC
  Filled 2021-12-15: qty 20

## 2021-12-15 MED ORDER — ACETAMINOPHEN 325 MG PO TABS
325.0000 mg | ORAL_TABLET | Freq: Four times a day (QID) | ORAL | Status: DC | PRN
  Administered 2021-12-18: 650 mg via ORAL
  Filled 2021-12-15: qty 2

## 2021-12-15 MED ORDER — LIDOCAINE 2% (20 MG/ML) 5 ML SYRINGE
INTRAMUSCULAR | Status: DC | PRN
  Administered 2021-12-15: 100 mg via INTRAVENOUS

## 2021-12-15 MED ORDER — ACETAMINOPHEN 10 MG/ML IV SOLN
1000.0000 mg | Freq: Once | INTRAVENOUS | Status: DC | PRN
Start: 2021-12-15 — End: 2021-12-15

## 2021-12-15 MED ORDER — CHLORHEXIDINE GLUCONATE 4 % EX LIQD
60.0000 mL | Freq: Once | CUTANEOUS | Status: AC
Start: 2021-12-15 — End: 2021-12-15
  Administered 2021-12-15: 4 via TOPICAL
  Filled 2021-12-15: qty 15

## 2021-12-15 MED ORDER — PHENOL 1.4 % MT LIQD: 1.0000 | OROMUCOSAL | Status: DC | PRN

## 2021-12-15 MED ORDER — PHENYLEPHRINE HCL-NACL 20-0.9 MG/250ML-% IV SOLN
INTRAVENOUS | Status: DC | PRN
  Administered 2021-12-15: 50 ug/min via INTRAVENOUS

## 2021-12-15 MED ORDER — SODIUM CHLORIDE 0.9 % IV SOLN: INTRAVENOUS | Status: DC

## 2021-12-15 MED ORDER — ORAL CARE MOUTH RINSE: 15.0000 mL | Freq: Once | OROMUCOSAL | Status: AC

## 2021-12-15 MED ORDER — DEXAMETHASONE SODIUM PHOSPHATE 10 MG/ML IJ SOLN
INTRAMUSCULAR | Status: AC
  Filled 2021-12-15: qty 1

## 2021-12-15 MED ORDER — TRANEXAMIC ACID-NACL 1000-0.7 MG/100ML-% IV SOLN
1000.0000 mg | Freq: Once | INTRAVENOUS | Status: AC
  Administered 2021-12-15: 1000 mg via INTRAVENOUS
  Filled 2021-12-15: qty 100

## 2021-12-15 MED ORDER — FENTANYL CITRATE PF 50 MCG/ML IJ SOSY
50.0000 ug | PREFILLED_SYRINGE | Freq: Once | INTRAMUSCULAR | Status: AC
  Administered 2021-12-15: 50 ug via INTRAVENOUS
  Filled 2021-12-15: qty 1

## 2021-12-15 MED ORDER — DOCUSATE SODIUM 100 MG PO CAPS
100.0000 mg | ORAL_CAPSULE | Freq: Two times a day (BID) | ORAL | Status: DC
  Administered 2021-12-15 – 2021-12-18 (×4): 100 mg via ORAL
  Filled 2021-12-15 (×5): qty 1

## 2021-12-15 MED ORDER — MORPHINE SULFATE (PF) 2 MG/ML IV SOLN: 0.5000 mg | INTRAVENOUS | Status: DC | PRN

## 2021-12-15 MED ORDER — HYDROCODONE-ACETAMINOPHEN 5-325 MG PO TABS: 1.0000 | ORAL_TABLET | Freq: Four times a day (QID) | ORAL | Status: DC | PRN

## 2021-12-15 MED ORDER — ROPIVACAINE HCL 5 MG/ML IJ SOLN
INTRAMUSCULAR | Status: DC | PRN
  Administered 2021-12-15: 20 mL via PERINEURAL

## 2021-12-15 MED ORDER — SUCCINYLCHOLINE CHLORIDE 200 MG/10ML IV SOSY
PREFILLED_SYRINGE | INTRAVENOUS | Status: DC | PRN
  Administered 2021-12-15: 100 mg via INTRAVENOUS

## 2021-12-15 MED ORDER — FENTANYL CITRATE (PF) 250 MCG/5ML IJ SOLN
INTRAMUSCULAR | Status: AC
  Filled 2021-12-15: qty 5

## 2021-12-15 MED ORDER — CEFAZOLIN SODIUM-DEXTROSE 2-4 GM/100ML-% IV SOLN
2.0000 g | Freq: Four times a day (QID) | INTRAVENOUS | Status: AC
  Administered 2021-12-15 – 2021-12-16 (×2): 2 g via INTRAVENOUS
  Filled 2021-12-15 (×2): qty 100

## 2021-12-15 MED ORDER — PROCHLORPERAZINE EDISYLATE 10 MG/2ML IJ SOLN
5.0000 mg | Freq: Once | INTRAMUSCULAR | Status: AC
  Administered 2021-12-15: 5 mg via INTRAVENOUS
  Filled 2021-12-15: qty 2

## 2021-12-15 MED ORDER — CHLORHEXIDINE GLUCONATE CLOTH 2 % EX PADS
6.0000 | MEDICATED_PAD | Freq: Once | CUTANEOUS | Status: AC
  Administered 2021-12-15: 6 via TOPICAL

## 2021-12-15 MED ORDER — DEXAMETHASONE SODIUM PHOSPHATE 10 MG/ML IJ SOLN
INTRAMUSCULAR | Status: DC | PRN
  Administered 2021-12-15: 4 mg via INTRAVENOUS

## 2021-12-15 MED ORDER — ENOXAPARIN SODIUM 30 MG/0.3ML IJ SOSY
30.0000 mg | PREFILLED_SYRINGE | INTRAMUSCULAR | Status: DC
  Administered 2021-12-16 – 2021-12-18 (×3): 30 mg via SUBCUTANEOUS
  Filled 2021-12-15 (×3): qty 0.3

## 2021-12-15 MED ORDER — MENTHOL 3 MG MT LOZG
1.0000 | LOZENGE | OROMUCOSAL | Status: DC | PRN
Start: 2021-12-15 — End: 2021-12-18

## 2021-12-15 MED ORDER — ONDANSETRON HCL 4 MG PO TABS
4.0000 mg | ORAL_TABLET | Freq: Four times a day (QID) | ORAL | Status: DC | PRN
  Administered 2021-12-17 – 2021-12-18 (×3): 4 mg via ORAL
  Filled 2021-12-15 (×3): qty 1

## 2021-12-15 MED ORDER — PROPOFOL 10 MG/ML IV BOLUS
INTRAVENOUS | Status: AC
  Filled 2021-12-15: qty 20

## 2021-12-15 SURGICAL SUPPLY — 48 items
BAG COUNTER SPONGE SURGICOUNT (BAG) IMPLANT
BAG ZIPLOCK 12X15 (MISCELLANEOUS) IMPLANT
BIT DRILL CALIBRATED 4.2 (BIT) IMPLANT
BIT DRILL CANN 16 HIP (BIT) ×1 IMPLANT
BIT DRILL CANN STP 6/9 HIP (BIT) ×1 IMPLANT
BLADE SURG 15 STRL LF DISP TIS (BLADE) ×1 IMPLANT
BLADE SURG 15 STRL SS (BLADE) ×1
BNDG GAUZE DERMACEA FLUFF (GAUZE/BANDAGES/DRESSINGS) ×1
BNDG GAUZE DERMACEA FLUFF 4 (GAUZE/BANDAGES/DRESSINGS) ×1 IMPLANT
COVER SURGICAL LIGHT HANDLE (MISCELLANEOUS) ×2 IMPLANT
DERMABOND ADVANCED (GAUZE/BANDAGES/DRESSINGS) ×1
DERMABOND ADVANCED .7 DNX12 (GAUZE/BANDAGES/DRESSINGS) IMPLANT
DRAPE C-ARM 42X120 X-RAY (DRAPES) ×2 IMPLANT
DRAPE C-ARMOR (DRAPES) ×2 IMPLANT
DRAPE STERI IOBAN 125X83 (DRAPES) ×2 IMPLANT
DRILL BIT CALIBRATED 4.2 (BIT) ×2
DRSG AQUACEL AG ADV 3.5X 4 (GAUZE/BANDAGES/DRESSINGS) IMPLANT
DRSG AQUACEL AG ADV 3.5X 6 (GAUZE/BANDAGES/DRESSINGS) ×2 IMPLANT
DRSG AQUACEL AG ADV 3.5X14 (GAUZE/BANDAGES/DRESSINGS) IMPLANT
DURAPREP 26ML APPLICATOR (WOUND CARE) ×2 IMPLANT
ELECT PENCIL ROCKER SW 15FT (MISCELLANEOUS) ×1 IMPLANT
ELECT REM PT RETURN 15FT ADLT (MISCELLANEOUS) ×2 IMPLANT
GLOVE BIO SURGEON STRL SZ8 (GLOVE) ×4 IMPLANT
GLOVE BIOGEL PI IND STRL 8 (GLOVE) ×2 IMPLANT
GLOVE BIOGEL PI INDICATOR 8 (GLOVE) ×2
GLOVE ORTHO TXT STRL SZ7.5 (GLOVE) ×2 IMPLANT
GOWN STRL REUS W/ TWL XL LVL3 (GOWN DISPOSABLE) ×1 IMPLANT
GOWN STRL REUS W/TWL XL LVL3 (GOWN DISPOSABLE) ×1
GUIDEWIRE 3.2X400 (WIRE) ×2 IMPLANT
IMPL DEG TI CANN 11MM/130 (Orthopedic Implant) IMPLANT
IMPLANT DEG TI CANN 11MM/130 (Orthopedic Implant) ×2 IMPLANT
KIT BASIN OR (CUSTOM PROCEDURE TRAY) ×2 IMPLANT
KIT TURNOVER KIT A (KITS) IMPLANT
MANIFOLD NEPTUNE II (INSTRUMENTS) ×2 IMPLANT
NS IRRIG 1000ML POUR BTL (IV SOLUTION) IMPLANT
PACK GENERAL/GYN (CUSTOM PROCEDURE TRAY) ×2 IMPLANT
PROTECTOR NERVE ULNAR (MISCELLANEOUS) ×2 IMPLANT
SCREW FENES TFNA 95 (Screw) ×1 IMPLANT
SCREW LOCK IM 5X36 (Screw) ×1 IMPLANT
SCREW LOCK X25 36X5X IM NL (Screw) IMPLANT
STRIP CLOSURE SKIN 1/2X4 (GAUZE/BANDAGES/DRESSINGS) ×1 IMPLANT
SUT MNCRL AB 3-0 PS2 18 (SUTURE) ×1 IMPLANT
SUT MON AB 2-0 CT1 36 (SUTURE) ×2 IMPLANT
SUT MON AB 3-0 SH 27 (SUTURE) ×1
SUT MON AB 3-0 SH27 (SUTURE) ×1 IMPLANT
SUT PDS AB 1 CT1 27 (SUTURE) ×2 IMPLANT
SUT VIC AB 0 CT1 27 (SUTURE) ×1
SUT VIC AB 0 CT1 27XBRD ANTBC (SUTURE) IMPLANT

## 2021-12-15 NOTE — ED Notes (Signed)
Fall risk precautions implemented. Yellow grippy socks, fall risk wristband applied, bed alarm activated.  

## 2021-12-15 NOTE — H&P (Signed)
History and Physical    Patient: Anna Ward ION:[id number] DOB: Jun 09, 1932 DOA: 12/15/2021 DOS: the patient was seen and examined on 12/15/2021 PCP: Sharon Seller, NP  Patient coming from: Home  Chief Complaint:  Chief Complaint  Patient presents with   Fall   HPI: Anna Ward is a 88 y.o. female with medical history significant of chronic low back pain. Presenting with left hip pain after a fall. She was in her kitchen last night and she felt dizzy for a second -- she apparently has intermittent bouts of vertigo. When she tried to catch herself on her counter, she missed and fell to the floor on her left side. It was an unwitnessed fall. She didn't hit her head. She did not suffer LOC. She tried to get up, but was unable to on her own. She was down for about 2 hours before she was able to get to her phone and call for help. She had no CP, palpitations prior to her fall. Her last fall was 1 year ago. She normally walks unassisted and she is fairly active. She denies any other aggravating or alleviating factors.   Review of Systems: As mentioned in the history of present illness. All other systems reviewed and are negative. Past Medical History:  Diagnosis Date   History of bone density study    History of CT scan    Osteoporosis    Past Surgical History:  Procedure Laterality Date   APPENDECTOMY     CATARACT EXTRACTION     HERNIA REPAIR     Social History:  reports that she has quit smoking. Her smoking use included cigarettes. She has a 2.50 pack-year smoking history. She has never used smokeless tobacco. She reports current alcohol use of about 7.0 standard drinks of alcohol per week. She reports that she does not use drugs.  No Known Allergies  Family History  Problem Relation Age of Onset   Pancreatic cancer Father     Prior to Admission medications   Medication Sig Start Date End Date Taking? Authorizing Provider  acetaminophen (TYLENOL) 500 MG tablet Take 1,000  mg by mouth every 6 (six) hours as needed for mild pain.   Yes [provider]  Ascorbic Acid (VITAMIN C PO) Take 1 tablet by mouth daily.   Yes [provider]  CALCIUM PO Take 1 tablet by mouth daily.   Yes [provider]  Carboxymethylcellulose Sodium (EYE DROPS OP) Place 1 drop into both eyes daily as needed (dry eyes).   Yes [provider]  Ibuprofen-diphenhydrAMINE Cit (ADVIL PM) 200-38 MG TABS Take 1 tablet by mouth at bedtime as needed (sleep).   Yes [provider]  Multiple Vitamin (MULTIVITAMIN) tablet Take 1 tablet by mouth daily.   Yes [provider]    Physical Exam: Vitals:   12/15/21 0230 12/15/21 0240 12/15/21 0315 12/15/21 0542  BP: (!) 168/83  131/86 (!) 156/88  Pulse: (!) 102  (!) 105 100  Resp: 17  16 17   Temp:  98.2 F (36.8 C) 97.6 F (36.4 C) 98 F (36.7 C)  TempSrc:  Oral Oral Oral  SpO2: 96%  98% 97%  Weight:      Height:       General: 88 y.o. female resting in bed in NAD Eyes: PERRL, normal sclera ENMT: Nares patent w/o discharge, orophaynx clear, dentition normal, ears w/o discharge/lesions/ulcers Neck: Supple, trachea midline Cardiovascular: RRR, +S1, S2, no m/g/r, equal pulses throughout Respiratory: CTABL, no w/r/r, normal  WOB GI: BS+, NDNT, no masses noted, no organomegaly noted MSK: No e/c/c; limited ROM left hip d/t pain Neuro: A&O x 3, other than hard of hearing no focal deficits Psyc: Appropriate interaction and affect, calm/cooperative  Data Reviewed:  Na+  139 K+  4.6 BUN  40 SCr  1.20 WBC  10.7 Hgb  9.3 Plt  229  XR left hip: Left femoral intertrochanteric fracture with varus angulation.  CT c-spine Diffuse advanced degenerative disc and facet disease. No acute bony abnormality.  CTH Atrophy, chronic microvascular disease. No acute intracranial abnormality.  CXR: Right base atelectasis.  Assessment and Plan: Left hip fracture Fall     - admit to inpt, tele      - XR as above     - NPO; ortho to see, possible surgery today     - PT/OT after procedure     - pain control     - DVT PPx per ortho  Normocytic anemia     - she is at baseline; no evidence of bleed     - check iron studies  Leukocytosis     - no fever, CXR negative; likely reactive     - check UA  CKD 3a     - at baseline, follow  Advance Care Planning:   Code Status: DNR  Consults: Ortho (Dr. Susa Simmonds)  Family Communication: w/ son at bedside  Severity of Illness: The appropriate patient status for this patient is INPATIENT. Inpatient status is judged to be reasonable and necessary in order to provide the required intensity of service to ensure the patient's safety. The patient's presenting symptoms, physical exam findings, and initial radiographic and laboratory data in the context of their chronic comorbidities is felt to place them at high risk for further clinical deterioration. Furthermore, it is not anticipated that the patient will be medically stable for discharge from the hospital within 2 midnights of admission.   * I certify that at the point of admission it is my clinical judgment that the patient will require inpatient hospital care spanning beyond 2 midnights from the point of admission due to high intensity of service, high risk for further deterioration and high frequency of surveillance required.*  Author: Teddy Spike, DO 12/15/2021 7:10 AM  For on call review [email or web address].

## 2021-12-16 ENCOUNTER — Encounter (HOSPITAL_COMMUNITY): Payer: Self-pay | Admitting: Orthopedic Surgery

## 2021-12-16 DIAGNOSIS — E440 Moderate protein-calorie malnutrition: Secondary | ICD-10-CM | POA: Insufficient documentation

## 2021-12-16 DIAGNOSIS — S72002A Fracture of unspecified part of neck of left femur, initial encounter for closed fracture: Secondary | ICD-10-CM | POA: Diagnosis not present

## 2021-12-16 LAB — CBC
HCT: 23.8 % — ABNORMAL LOW (ref 36.0–46.0)
Hemoglobin: 7.7 g/dL — ABNORMAL LOW (ref 12.0–15.0)
MCH: 30.7 pg (ref 26.0–34.0)
MCHC: 32.4 g/dL (ref 30.0–36.0)
MCV: 94.8 fL (ref 80.0–100.0)
Platelets: 185 10*3/uL (ref 150–400)
RBC: 2.51 MIL/uL — ABNORMAL LOW (ref 3.87–5.11)
RDW: 12.3 % (ref 11.5–15.5)
WBC: 9 10*3/uL (ref 4.0–10.5)
nRBC: 0 % (ref 0.0–0.2)

## 2021-12-16 LAB — URINALYSIS, ROUTINE W REFLEX MICROSCOPIC
Bilirubin Urine: NEGATIVE
Glucose, UA: 50 mg/dL — AB
Hgb urine dipstick: NEGATIVE
Ketones, ur: 5 mg/dL — AB
Leukocytes,Ua: NEGATIVE
Nitrite: NEGATIVE
Protein, ur: NEGATIVE mg/dL
Specific Gravity, Urine: 1.018 (ref 1.005–1.030)
pH: 5 (ref 5.0–8.0)

## 2021-12-16 LAB — IRON AND TIBC
Iron: 22 ug/dL — ABNORMAL LOW (ref 28–170)
Saturation Ratios: 7 % — ABNORMAL LOW (ref 10.4–31.8)
TIBC: 309 ug/dL (ref 250–450)
UIBC: 287 ug/dL

## 2021-12-16 LAB — BASIC METABOLIC PANEL
Anion gap: 9 (ref 5–15)
BUN: 20 mg/dL (ref 8–23)
CO2: 22 mmol/L (ref 22–32)
Calcium: 8.5 mg/dL — ABNORMAL LOW (ref 8.9–10.3)
Chloride: 106 mmol/L (ref 98–111)
Creatinine, Ser: 0.72 mg/dL (ref 0.44–1.00)
GFR, Estimated: >60 mL/min (ref >60)
Glucose, Bld: 176 mg/dL — ABNORMAL HIGH (ref 70–99)
Potassium: 4 mmol/L (ref 3.5–5.1)
Sodium: 137 mmol/L (ref 135–145)

## 2021-12-16 MED ORDER — LACTATED RINGERS IV SOLN: INTRAVENOUS | Status: AC

## 2021-12-16 MED ORDER — ENSURE ENLIVE PO LIQD
237.0000 mL | Freq: Two times a day (BID) | ORAL | Status: DC
  Administered 2021-12-16 – 2021-12-18 (×4): 237 mL via ORAL

## 2021-12-16 NOTE — Progress Notes (Signed)
Orthopaedics Daily Progress Note   12/16/2021   6:37 AM  Anna Ward is a 88 y.o. female 1 Day Post-Op s/p INTRAMEDULLARY (IM) NAIL FEMORAL  Subjective Pain appropriately controlled.  Having some nausea/vomiting.  Also complaining of BUE tremors, which is not baseline for her.    Objective Vitals:   12/16/21 0223 12/16/21 0536  BP: 139/64 137/66  Pulse: 90 87  Resp: 14 12  Temp: 98.2 F (36.8 C) 98 F (36.7 C)  SpO2: 100% 96%    Intake/Output Summary (Last 24 hours) at 12/16/2021 5188 Last data filed at 12/16/2021 0600 Gross per 24 hour  Intake 4715.3 ml  Output 750 ml  Net 3965.3 ml    Physical Exam LLE: Dressing clean, dry, and intact +DF/PF/EHL SILT SP/DP/T +DP/PT and WWP distally  Assessment 88 y.o. female s/p Procedure(s) (LRB): INTRAMEDULLARY (IM) NAIL FEMORAL (Left)  Plan BUE tremors: Evaluation by primary medical team Mobility: Out of bed with PT/OT Pain control: Continue to wean/titrate to appropriate oral regimen DVT Prophylaxis: Lovenox 40 mg daily x6 weeks Further surgical plans: None RUE: Weightbearing as tolerated, no restrictions LUE: Weightbearing as tolerated, no restrictions RLE: Weightbearing as tolerated, no restrictions LLE: Weightbearing as tolerated, no restrictions Dressing care: Keep AQUACEL on and dry for up to 14 days.  Do not allow surgical area to get wet before that.  Remove AQUACEL dressing after 14 days and allow area to get wet in shower but DO NOT SUBMERGE until wound is evaluated in clinic.  In most cases skin glue is used and no additional dressing is necessary.  Disposition: Per primary team as medically appropriate Follow-up: Please call [facility] [phone]) to schedule follow-op appointment for 2 weeks after surgery.   Ernestina Columbia M.D. Orthopaedic Surgery [facility]

## 2021-12-16 NOTE — Evaluation (Signed)
Physical Therapy Evaluation Patient Details Name: Anna Ward MRN: [id number] DOB: [birth year] Today's Date: 12/16/2021  History of Present Illness  88 y.o. female admitted 12/15/21 with a fall in which she sustained a Left femoral intertrochanteric fracture , s/p IM nail. PMH: osteoporosis.  Clinical Impression  Pt admitted with above diagnosis. Pt ambulated to the bathroom then into hallway where she began to feel "sick" so was assisted to the recliner where she reported nausea, then became unresponsive and tremulous. RN called to room, vital signs were WNL and pt became alert. ST-SNF recommended.  Pt currently with functional limitations due to the deficits listed below (see PT Problem List). Pt will benefit from skilled PT to increase their independence and safety with mobility to allow discharge to the venue listed below.          Recommendations for follow up therapy are one component of a multi-disciplinary discharge planning process, led by the attending physician.  Recommendations may be updated based on patient status, additional functional criteria and insurance authorization.  Follow Up Recommendations Skilled nursing-short term rehab (<3 hours/day) Can patient physically be transported by private vehicle: Yes    Assistance Recommended at Discharge Intermittent Supervision/Assistance  Patient can return home with the following  A lot of help with bathing/dressing/bathroom;A little help with walking and/or transfers;Assistance with cooking/housework;Assist for transportation    Equipment Recommendations Rolling walker (2 wheels)  Recommendations for Other Services       Functional Status Assessment Patient has had a recent decline in their functional status and demonstrates the ability to make significant improvements in function in a reasonable and predictable amount of time.     Precautions / Restrictions Precautions Precautions: Fall Precaution Comments: h/o  vertigo Restrictions Weight Bearing Restrictions: No      Mobility  Bed Mobility Overal bed mobility: Needs Assistance Bed Mobility: Supine to Sit     Supine to sit: Min assist     General bed mobility comments: assist to pivot hips to edge of bed    Transfers Overall transfer level: Needs assistance Equipment used: Rolling walker (2 wheels) Transfers: Sit to/from Stand Sit to Stand: Min assist, From elevated surface           General transfer comment: VCs for hand placment, min A to power up    Ambulation/Gait Ambulation/Gait assistance: Min guard Gait Distance (Feet): 24 Feet Assistive device: Rolling walker (2 wheels) Gait Pattern/deviations: Step-to pattern, Decreased step length - right, Decreased step length - left Gait velocity: decr     General Gait Details: VCs for posture (signifcant cervical kyphosis) and sequencing, distance limited by pt feeling "sick", assisted her to a recliner where she reported nausea then became unresponsive and tremulous. RN called to room. O2 applied, vital signs were stable. Pt alert at end of session.  Stairs            Wheelchair Mobility    Modified Rankin (Stroke Patients Only)       Balance Overall balance assessment: History of Falls, Needs assistance Sitting-balance support: Feet supported Sitting balance-Leahy Scale: Fair     Standing balance support: Single extremity supported Standing balance-Leahy Scale: Poor Standing balance comment: stable with single UE support                             Pertinent Vitals/Pain Pain Assessment Pain Assessment: No/denies pain    Home Living Family/patient expects to be discharged to:: Skilled nursing facility Living  Arrangements: Alone   Type of Home: Apartment Home Access: Level entry;Elevator       Home Layout: One level Home Equipment: Rollator (4 wheels);Cane - single point      Prior Function Prior Level of Function :  Independent/Modified Independent             Mobility Comments: walked without AD PTA       Hand Dominance        Extremity/Trunk Assessment   Upper Extremity Assessment Upper Extremity Assessment: Overall WFL for tasks assessed    Lower Extremity Assessment Lower Extremity Assessment: LLE deficits/detail LLE Deficits / Details: grossly WFL for bed mobility, transfers, ambulation with RW, did not fully assess as pt became unresponsive at end of session LLE Sensation: WNL LLE Coordination: WNL    Cervical / Trunk Assessment Cervical / Trunk Assessment: Kyphotic (significant cervical kyphosis)  Communication   Communication: HOH  Cognition Arousal/Alertness: Awake/alert Behavior During Therapy: WFL for tasks assessed/performed Overall Cognitive Status: Within Functional Limits for tasks assessed                                          General Comments      Exercises     Assessment/Plan    PT Assessment Patient needs continued PT services  PT Problem List Decreased strength;Decreased mobility;Decreased activity tolerance       PT Treatment Interventions Gait training;Therapeutic exercise;Therapeutic activities;Functional mobility training    PT Goals (Current goals can be found in the Care Plan section)  Acute Rehab PT Goals Patient Stated Goal: go to rehab to get stronger PT Goal Formulation: With patient/family Time For Goal Achievement: 12/30/21 Potential to Achieve Goals: Good    Frequency Min 3X/week     Co-evaluation               AM-PAC PT "6 Clicks" Mobility  Outcome Measure Help needed turning from your back to your side while in a flat bed without using bedrails?: A Little Help needed moving from lying on your back to sitting on the side of a flat bed without using bedrails?: A Little Help needed moving to and from a bed to a chair (including a wheelchair)?: A Little Help needed standing up from a chair using your  arms (e.g., wheelchair or bedside chair)?: A Little Help needed to walk in hospital room?: A Little Help needed climbing 3-5 steps with a railing? : A Lot 6 Click Score: 17    End of Session Equipment Utilized During Treatment: Gait belt Activity Tolerance: Treatment limited secondary to medical complications (Comment) (nausea, briefly unresponsive) Patient left: in chair;with family/visitor present;with nursing/sitter in room;with call bell/phone within reach Nurse Communication: Mobility status PT Visit Diagnosis: Difficulty in walking, not elsewhere classified (R26.2)    Time: 3149-7026 PT Time Calculation (min) (ACUTE ONLY): 37 min   Charges:   PT Evaluation $PT Eval Moderate Complexity: 1 Mod PT Treatments $Gait Training: 8-22 mins       Ralene Bathe Kistler PT 12/16/2021  Acute Rehabilitation Services  Office [phone]

## 2021-12-16 NOTE — Plan of Care (Signed)
  Problem: Clinical Measurements: Goal: Respiratory complications will improve Outcome: Progressing   Problem: Activity: Goal: Risk for activity intolerance will decrease Outcome: Progressing   Problem: Nutrition: Goal: Adequate nutrition will be maintained Outcome: Progressing   

## 2021-12-16 NOTE — Progress Notes (Signed)
Physical Therapy Treatment Patient Details Name: Anna Ward MRN: [id number] DOB: Sep 04, 1931 Today's Date: 12/16/2021   History of Present Illness 88 y.o. female admitted 12/15/21 with a fall in which she sustained a Left femoral intertrochanteric fracture , s/p IM nail. PMH: osteoporosis.    PT Comments    Pt ambulated 10' in the room with RW with verbal cues for posture and positioning in RW. Instructed pt in supine L hip exercises for strengthening and ROM.    Recommendations for follow up therapy are one component of a multi-disciplinary discharge planning process, led by the attending physician.  Recommendations may be updated based on patient status, additional functional criteria and insurance authorization.  Follow Up Recommendations  Skilled nursing-short term rehab (<3 hours/day) Can patient physically be transported by private vehicle: Yes   Assistance Recommended at Discharge Intermittent Supervision/Assistance  Patient can return home with the following A lot of help with bathing/dressing/bathroom;A little help with walking and/or transfers;Assistance with cooking/housework;Assist for transportation   Equipment Recommendations  Rolling walker (2 wheels)    Recommendations for Other Services       Precautions / Restrictions Precautions Precautions: Fall Precaution Comments: h/o vertigo Restrictions Weight Bearing Restrictions: No     Mobility  Bed Mobility Overal bed mobility: Needs Assistance Bed Mobility: Sit to Supine     Supine to sit: Min assist Sit to supine: Min assist   General bed mobility comments: min A for BLEs into bed    Transfers Overall transfer level: Needs assistance Equipment used: Rolling walker (2 wheels) Transfers: Sit to/from Stand Sit to Stand: Min assist, From elevated surface           General transfer comment: VCs for hand placment, min A to power up    Ambulation/Gait Ambulation/Gait assistance: Min guard Gait  Distance (Feet): 10 Feet Assistive device: Rolling walker (2 wheels) Gait Pattern/deviations: Step-to pattern, Decreased step length - right, Decreased step length - left Gait velocity: decr     General Gait Details: VCs for posture (signifcant cervical kyphosis) and sequencing.   Stairs             Wheelchair Mobility    Modified Rankin (Stroke Patients Only)       Balance Overall balance assessment: History of Falls, Needs assistance Sitting-balance support: Feet supported Sitting balance-Leahy Scale: Fair     Standing balance support: Single extremity supported Standing balance-Leahy Scale: Poor Standing balance comment: stable with single UE support                            Cognition Arousal/Alertness: Awake/alert Behavior During Therapy: WFL for tasks assessed/performed Overall Cognitive Status: Within Functional Limits for tasks assessed                                          Exercises General Exercises - Lower Extremity Ankle Circles/Pumps: AROM, Both, 10 reps, Supine Quad Sets: AROM, Left, 5 reps, Supine Short Arc Quad: AROM, Left, 10 reps, Supine Heel Slides: AAROM, Left, 5 reps, Supine Hip ABduction/ADduction: AAROM, Left, 5 reps, Supine    General Comments        Pertinent Vitals/Pain Pain Assessment Pain Assessment: No/denies pain    Home Living Family/patient expects to be discharged to:: Skilled nursing facility Living Arrangements: Alone   Type of Home: Apartment Home Access: Level entry;Elevator  Home Layout: One level Home Equipment: Rollator (4 wheels);Cane - single point      Prior Function            PT Goals (current goals can now be found in the care plan section) Acute Rehab PT Goals Patient Stated Goal: go to rehab to get stronger PT Goal Formulation: With patient/family Time For Goal Achievement: 12/30/21 Potential to Achieve Goals: Good Progress towards PT goals: Progressing  toward goals    Frequency    Min 3X/week      PT Plan Current plan remains appropriate    Co-evaluation              AM-PAC PT "6 Clicks" Mobility   Outcome Measure  Help needed turning from your back to your side while in a flat bed without using bedrails?: A Little Help needed moving from lying on your back to sitting on the side of a flat bed without using bedrails?: A Little Help needed moving to and from a bed to a chair (including a wheelchair)?: A Little Help needed standing up from a chair using your arms (e.g., wheelchair or bedside chair)?: A Little Help needed to walk in hospital room?: A Little Help needed climbing 3-5 steps with a railing? : A Lot 6 Click Score: 17    End of Session Equipment Utilized During Treatment: Gait belt Activity Tolerance: Treatment limited secondary to medical complications (Comment) (nausea, briefly unresponsive) Patient left: with call bell/phone within reach;in bed;with bed alarm set Nurse Communication: Mobility status PT Visit Diagnosis: Difficulty in walking, not elsewhere classified (R26.2)     Time: 9381-0175 PT Time Calculation (min) (ACUTE ONLY): 18 min  Charges:  $Therapeutic Activity: 8-22 mins                    Ralene Bathe Kistler PT 12/16/2021  Acute Rehabilitation Services  Office [phone]

## 2021-12-16 NOTE — Hospital Course (Addendum)
89 y.o.f w/ significant history of chronic low back pain, osteoporosis presented with left hip pain after a fall. In her kitchen she felt dizzy for a second -- she apparently has intermittent bouts of vertigo. When she tried to catch herself on her counter, she missed and fell to the floor on her left side.Seen in the ED found to have left hip fracture orthopedic was consulted.  Was hemodynamically stable, labs showed leukocytosis 10.7, anemia w/ hb in 9s gm.  Patient underwent IM femoral nail of the left hip by Dr.Looney 12/15/21 12/16/21 She remained afebrile, creatinine improved to 0.7 from 1.2 indicating AKI that has resolved, hemoglobin decreased to 7.7 g indicating acute blood loss anemia in the setting of hip fracture and surgery, and also from hemodilution.  Hemoglobin improved to 9 g after transfusion.  Remains afebrile hemodynamically stable, and will be discharged to skilled nursing facility for further PT OT

## 2021-12-16 NOTE — Progress Notes (Signed)
Initial Nutrition Assessment  DOCUMENTATION CODES:   Non-severe (moderate) malnutrition in context of social or environmental circumstances  INTERVENTION:  Encourage adequate PO intake Ensure Enlive po BID, each supplement provides 350 kcal and 20 grams of protein.  NUTRITION DIAGNOSIS:   Moderate Malnutrition related to social / environmental circumstances (lives alone) as evidenced by moderate fat depletion, moderate muscle depletion, severe muscle depletion.  GOAL:   Patient will meet greater than or equal to 90% of their needs  MONITOR:   PO intake, Supplement acceptance, Labs, Weight trends  REASON FOR ASSESSMENT:   Consult Assessment of nutrition requirement/status, Hip fracture protocol  ASSESSMENT:   Pt admitted from home after a fall leading to L hip fracture. S/p IMN femoral repair 06/27. PMH significant for chronic lower back pain.  Spoke with pt at bedside. She reports feeling better today and a returning appetite. Yesterday after surgery, she recalls having episodes of nausea and vomiting with tremors. She states that prior to her fall she was eating well at home. She lives alone and usually eats easy-to-prepare, frozen meals. She reports 3 meals per day including eggs and sausage for breakfast; apples and cheese with nuts for lunch and a frozen meal for dinner. She has had no recent changes to her intake. 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   Meal completions: 06/28: 0%-breakfast  Pt reports a usual wt around 105 lbs and denies recent weight loss. Reviewed wt history. Pt's wt appears stable at 44.5 kg (98 lbs). Will continue to monitor throughout admission.   Given pt with limited past medical history and report of consistent oral intake and stable wt without significant wt loss within the last year, suspect pt's malnutrition to be related to  her social/environmental circumstances as she lives alone. If able to obtain more detailed wt history, will reassess as appropriate.    Edema: non-pitting BLE  Medications: colace  Labs reviewed  NUTRITION - FOCUSED PHYSICAL EXAM:  Flowsheet Row Most Recent Value  Orbital Region Mild depletion  Upper Arm Region Severe depletion  Thoracic and Lumbar Region Moderate depletion  Buccal Region Moderate depletion  Temple Region Mild depletion  Clavicle Bone Region Moderate depletion  Clavicle and Acromion Bone Region Moderate depletion  Scapular Bone Region Moderate depletion  Dorsal Hand Severe depletion  Patellar Region Severe depletion  Anterior Thigh Region Severe depletion  Posterior Calf Region Moderate depletion  Edema (RD Assessment) None  Hair Reviewed  Eyes Reviewed  Mouth Reviewed  Skin Reviewed  Nails Reviewed       Diet Order:   Diet Order             Diet regular Room service appropriate? Yes; Fluid consistency: Thin  Diet effective now                   EDUCATION NEEDS:   No education needs have been identified at this time  Skin:  Skin Assessment: Skin Integrity Issues: Skin Integrity Issues:: Incisions Incisions: L hip (closed)  Last BM:  6/26  Height:   Ht Readings from Last 1 Encounters:  12/15/21 5' (1.524 m)    Weight:   Wt Readings from Last 1 Encounters:  12/15/21 44.5 kg   BMI:  Body mass index is 19.16 kg/m.  Estimated Nutritional Needs:   Kcal:  1300-1500  Protein:  65-80g  Fluid:  >/=  1.5L  Clayborne Dana, RDN, LDN Clinical Nutrition

## 2021-12-16 NOTE — NC FL2 (Signed)
[place] MEDICAID FL2 LEVEL OF CARE SCREENING TOOL     IDENTIFICATION  Patient Name: Anna Ward Birthdate: Sep 09, 1931 Sex: female Admission Date (Current Location): 12/15/2021  [facility] and [place] Number:  Producer, television/film/video and Address:  [facility],  [address] [place], [place] [address]      Provider Number: [id number]  Attending Physician Name and Address:  Lanae Boast, MD  Relative Name and Phone Number:  Roger Fasnacht (son) Ph: [phone]    Current Level of Care: Hospital Recommended Level of Care: Skilled Nursing Facility Prior Approval Number:    Date Approved/Denied:   PASRR Number: [id number]  Discharge Plan: SNF    Current Diagnoses: Patient Active Problem List   Diagnosis Date Noted   Malnutrition of moderate degree 12/16/2021   Closed left hip fracture (HCC) 12/15/2021   AKI (acute kidney injury) (HCC) 12/15/2021   Normocytic anemia 12/15/2021   Fall at home, initial encounter 12/15/2021   Leukocytosis 12/15/2021    Orientation RESPIRATION BLADDER Height & Weight     Self, Time, Situation, Place  Normal Incontinent Weight: 98 lb 1.7 oz (44.5 kg) Height:  5' (152.4 cm)  BEHAVIORAL SYMPTOMS/MOOD NEUROLOGICAL BOWEL NUTRITION STATUS   (N/A)  (N/A) Continent Diet (Regular diet)  AMBULATORY STATUS COMMUNICATION OF NEEDS Skin   Limited Assist Verbally Surgical wounds                       Personal Care Assistance Level of Assistance  Bathing, Feeding, Dressing Bathing Assistance: Limited assistance Feeding assistance: Independent Dressing Assistance: Limited assistance     Functional Limitations Info  Sight, Hearing, Speech Sight Info: Impaired Hearing Info: Impaired (Patient uses hearing aids.) Speech Info: Adequate    SPECIAL CARE FACTORS FREQUENCY  PT (By licensed PT), OT (By licensed OT)     PT Frequency: 5x's/week OT Frequency: 5x's/week            Contractures Contractures Info: Not present    Additional  Factors Info  Code Status, Allergies Code Status Info: Full Allergies Info: NKA           Current Medications (12/16/2021):  This is the current hospital active medication list Current Facility-Administered Medications  Medication Dose Route Frequency Provider Last Rate Last Admin   acetaminophen (TYLENOL) tablet 325-650 mg  325-650 mg Oral Q6H PRN Ernestina Columbia, MD       acetaminophen (TYLENOL) tablet 500 mg  500 mg Oral Q6H Ernestina Columbia, MD   500 mg at 12/16/21 0522   docusate sodium (COLACE) capsule 100 mg  100 mg Oral BID Ernestina Columbia, MD   100 mg at 12/15/21 2152   enoxaparin (LOVENOX) injection 30 mg  30 mg Subcutaneous Q24H Ernestina Columbia, MD   30 mg at 12/16/21 0830   feeding supplement (ENSURE ENLIVE / ENSURE PLUS) liquid 237 mL  237 mL Oral BID BM Kc, Ramesh, MD   237 mL at 12/16/21 1243   HYDROcodone-acetaminophen (NORCO) 7.5-325 MG per tablet 1-2 tablet  1-2 tablet Oral Q4H PRN Ernestina Columbia, MD       HYDROcodone-acetaminophen (NORCO/VICODIN) 5-325 MG per tablet 1-2 tablet  1-2 tablet Oral Q6H PRN Ronaldo Miyamoto, Tyrone A, DO       HYDROcodone-acetaminophen (NORCO/VICODIN) 5-325 MG per tablet 1-2 tablet  1-2 tablet Oral Q4H PRN Ernestina Columbia, MD       lactated ringers infusion   Intravenous Continuous Lanae Boast, MD 50 mL/hr at 12/16/21 1543 New Bag at 12/16/21 1543  menthol-cetylpyridinium (CEPACOL) lozenge 3 mg  1 lozenge Oral PRN Ernestina Columbia, MD       Or   phenol (CHLORASEPTIC) mouth spray 1 spray  1 spray Mouth/Throat PRN Ernestina Columbia, MD       morphine (PF) 2 MG/ML injection 0.5 mg  0.5 mg Intravenous Q2H PRN Ronaldo Miyamoto, Tyrone A, DO       morphine (PF) 2 MG/ML injection 0.5-1 mg  0.5-1 mg Intravenous Q2H PRN Ernestina Columbia, MD       ondansetron [facility]) tablet 4 mg  4 mg Oral Q6H PRN Ernestina Columbia, MD       Or   ondansetron [facility]) injection 4 mg  4 mg Intravenous Q6H PRN Ernestina Columbia, MD   4 mg at 12/16/21 0830   tetrahydrozoline 0.05 % ophthalmic solution 1 drop  1  drop Both Eyes Daily PRN Margie Ege A, DO         Discharge Medications: Please see discharge summary for a list of discharge medications.  Relevant Imaging Results:  Relevant Lab Results:   Additional Information SSN: [id number]  Ewing Schlein, LCSW

## 2021-12-16 NOTE — TOC Initial Note (Signed)
Transition of Care [facility]) - Initial/Assessment Note   Patient Details  Name: Anna Ward MRN: [id number] Date of Birth: [birth year]  [facility] CM/SW Contact:    Ewing Schlein, LCSW Phone Number: 12/16/2021, 3:39 PM  Clinical Narrative: PT evaluation recommended SNF. Patient is hesitant to go, but is aware SNF was recommended. Family is in agreement on rehab as patient lives alone and fell resulting in a hip fracture. Son agreeable to SNF referral.  FL2 done; PASRR received. Initial referral faxed out. TOC awaiting bed offers.  Expected Discharge Plan: Skilled Nursing Facility Barriers to Discharge: Continued Medical Work up  Patient Goals and CMS Choice Patient states their goals for this hospitalization and ongoing recovery are:: Get better CMS Medicare.gov Compare Post Acute Care list provided to:: Patient Represenative (must comment) Choice offered to / list presented to : Patient, Adult Children  Expected Discharge Plan and Services Expected Discharge Plan: Skilled Nursing Facility In-house Referral: Clinical Social Work Discharge Planning Services: NA Post Acute Care Choice: Skilled Nursing Facility Living arrangements for the past 2 months: Apartment            DME Arranged: N/A DME Agency: NA  Prior Living Arrangements/Services Living arrangements for the past 2 months: Apartment Lives with:: Self Patient language and need for interpreter reviewed:: Yes Do you feel safe going back to the place where you live?: Yes      Need for Family Participation in Patient Care: Yes (Comment) Care giver support system in place?: Yes (comment) Criminal Activity/Legal Involvement Pertinent to Current Situation/Hospitalization: No - Comment as needed  Activities of Daily Living Home Assistive Devices/Equipment: None ADL Screening (condition at time of admission) Patient's cognitive ability adequate to safely complete daily activities?: Yes Is the patient deaf or have  difficulty hearing?: Yes Does the patient have difficulty seeing, even when wearing glasses/contacts?: No Does the patient have difficulty concentrating, remembering, or making decisions?: No Patient able to express need for assistance with ADLs?: Yes Does the patient have difficulty dressing or bathing?: Yes Independently performs ADLs?: No Communication: Independent Dressing (OT): Needs assistance Is this a change from baseline?: Change from baseline, expected to last >3 days Grooming: Independent Feeding: Independent Bathing: Needs assistance Is this a change from baseline?: Change from baseline, expected to last >3 days Toileting: Needs assistance Is this a change from baseline?: Change from baseline, expected to last >3days In/Out Bed: Needs assistance Is this a change from baseline?: Change from baseline, expected to last >3 days Walks in Home: Needs assistance Is this a change from baseline?: Change from baseline, expected to last >3 days Does the patient have difficulty walking or climbing stairs?: No Weakness of Legs: None Weakness of Arms/Hands: None  Permission Sought/Granted Permission sought to share information with : Oceanographer granted to share information with : Yes, Verbal Permission Granted Permission granted to share info w AGENCY: SNFs  Emotional Assessment Appearance:: Appears stated age Attitude/Demeanor/Rapport: Apprehensive Affect (typically observed): Appropriate Orientation: : Oriented to Self, Oriented to Place, Oriented to  Time, Oriented to Situation Alcohol / Substance Use: Not Applicable Psych Involvement: No (comment)  Admission diagnosis:  Hip fracture (HCC) [S72.009A] Closed fracture of left hip, initial encounter (HCC) [S72.002A] Fall, initial encounter [W19.XXXA] Patient Active Problem List   Diagnosis Date Noted   Malnutrition of moderate degree 12/16/2021   Closed left hip fracture (HCC) 12/15/2021   AKI  (acute kidney injury) (HCC) 12/15/2021   Normocytic anemia 12/15/2021   Fall at home,  initial encounter 12/15/2021   Leukocytosis 12/15/2021   PCP:  Sharon Seller, NP Pharmacy:   [facility] [id number] - [address], [place] - [address] [place] [address] Phone: [phone] Fax: [phone]  Readmission Risk Interventions     No data to display

## 2021-12-16 NOTE — Progress Notes (Signed)
Staff called to room by physical therapy. Patient had just finished ambulating in hall and reported feeling nauseated. Patient returned to chair and upon sitting began shaking upper body and arms strongly while appearing disoriented. Therapy called for assist at this time. When RN entered room patient was trembling slightly, diaphoretic and pale, oriented and answering questions appropriately. VSS. Patient reports that she felt while walking but right at the end was when she started feeling "funny" and felt nauseated. Comfortable in chair, educated for safety. Family present at bedside.

## 2021-12-16 NOTE — Progress Notes (Addendum)
PROGRESS NOTE Anna Ward  [id number] DOB: Apr 24, 1932 DOA: 12/15/2021 PCP: Lauree Chandler, NP   Brief Narrative/Hospital Course: 88 y.o.f w/ significant history of chronic low back pain, osteoporosis presented with left hip pain after a fall. In her kitchen she felt dizzy for a second -- she apparently has intermittent bouts of vertigo. When she tried to catch herself on her counter, she missed and fell to the floor on her left side.Seen in the ED found to have left hip fracture orthopedic was consulted.  Was hemodynamically stable, labs showed leukocytosis 10.7, anemia w/ hb in 9s gm.  Patient underwent IM femoral nail of the left hip by Dr.Looney 12/15/21  6/28-overnight remained afebrile, creatinine improved to 0.7 from 1.2 indicating AKI that has resolved, hemoglobin decreased to 7.7 g indicating acute blood loss anemia in the setting of hip fracture and surgery, and also from hemodilution    Subjective: Seen and examined this morning complains of nausea pain is controlled.  No new complaints. Hard of hearing. Dizzy and nauseous with PT was having soft bp and chills too.  Assessment and Plan: Principal Problem:   Closed left hip fracture ([place]) Active Problems:   AKI (acute kidney injury) ([place])   Normocytic anemia   Fall at home, initial encounter   Leukocytosis   Malnutrition of moderate degree  Closed left hip fracture after a fall at home in the setting of osteoporosis: Had IM nailing done 6/27.  Continue PT OT, WBAT as tolerated, Aquacel dressing x14 days and outpatient follow-up for wound care.  Continue to skilled nursing facility.  Continue pain control bowel regimen  AKI: Resolved, discontinued IV fluids  Normocytic anemia Acute blood loss anemia: Anemia in the setting of hemodilution as well as blood loss in the setting of hip fracture and surgery.  Iron 22, add iron supplement.Repeat H&H anticipate transfusing if further downtrending, patient agreeable. Fall at  home, initial encounter. Recent Labs  Lab 12/15/21 0014 12/15/21 0035 12/16/21 0325  HGB 9.3* 9.9* 7.7*  HCT 28.3* 29.0* 23.8*    Leukocytosis: Resolved  Malnutrition of moderate degree:augment nutrition status Nutrition Problem: Moderate Malnutrition Etiology: social / environmental circumstances (lives alone) Signs/Symptoms: moderate fat depletion, moderate muscle depletion, severe muscle depletion Interventions: Ensure Enlive (each supplement provides 350kcal and 20 grams of protein)   DVT prophylaxis: enoxaparin (LOVENOX) injection 30 mg Start: 12/16/21 0800 SCDs Start: 12/15/21 1951 SCDs Start: 12/15/21 1951 Code Status:   Code Status: Full Code Family Communication: plan of care discussed with patient at bedside.Son was called and updated. Patient status is: Inpatient because of ongoing postop management following hip surgery Level of care: Telemetry   Dispo: The patient is from: home            Anticipated disposition: snf anticipated pending PT OT evaluation orthopedic clearance likely next 24 to 48 hours in  Mobility Assessment (last 72 hours)     Mobility Assessment     Row Name 12/16/21 1410 12/16/21 1256 12/16/21 0830 12/15/21 1035 12/15/21 0324   Does patient have an order for bedrest or is patient medically unstable -- -- No - Continue assessment No - Continue assessment No - Continue assessment   What is the highest level of mobility based on the progressive mobility assessment? Level 5 (Walks with assist in room/hall) - Balance while stepping forward/back and can walk in room with assist - Complete Level 5 (Walks with assist in room/hall) - Balance while stepping forward/back and can walk in room with assist -  Complete Level 3 (Stands with assist) - Balance while standing  and cannot march in place Level 1 (Bedfast) - Unable to balance while sitting on edge of bed Level 1 (Bedfast) - Unable to balance while sitting on edge of bed   Is the above level different from  baseline mobility prior to current illness? -- -- Yes - Recommend PT order Yes - Recommend PT order  surgery and post-op PT pending Yes - Recommend PT order             Objective: Vitals last 24 hrs: Vitals:   12/16/21 0223 12/16/21 0536 12/16/21 1007 12/16/21 1223  BP: 139/64 137/66 (!) 146/71 108/61  Pulse: 90 87 90 84  Resp: 14 12 18    Temp: 98.2 F (36.8 C) 98 F (36.7 C) 98.1 F (36.7 C)   TempSrc: Oral Oral Oral   SpO2: 100% 96% 98% 99%  Weight:      Height:       Weight change: 0.048 kg  Physical Examination: General exam: alert awake,older than stated age, weak appearing. HEENT:Oral mucosa moist, Ear/Nose WNL grossly, dentition normal. Respiratory system: bilaterally diminished BS, no use of accessory muscle Cardiovascular system: S1 & S2 +, No JVD. Gastrointestinal system: Abdomen soft,NT,ND, BS+ Nervous System:Alert, awake, moving extremities and grossly nonfocal Extremities: Left hip surgical site with Aquacel dressing present C/D/I  Skin: No rashes,no icterus. MSK: Normal muscle bulk,tone, power  Medications reviewed:  Scheduled Meds:  acetaminophen  500 mg Oral Q6H   docusate sodium  100 mg Oral BID   enoxaparin (LOVENOX) injection  30 mg Subcutaneous Q24H   feeding supplement  237 mL Oral BID BM   Continuous Infusions:    Diet Order             Diet regular Room service appropriate? Yes; Fluid consistency: Thin  Diet effective now                   Intake/Output Summary (Last 24 hours) at 12/16/2021 1432 Last data filed at 12/16/2021 0600 Gross per 24 hour  Intake 3510.31 ml  Output 150 ml  Net 3360.31 ml   Net IO Since Admission: 4,195.21 mL [12/16/21 1432]  Wt Readings from Last 3 Encounters:  12/15/21 44.5 kg  02/20/21 44.5 kg  02/16/21 45.1 kg     Unresulted Labs (From admission, onward)     Start     Ordered   12/17/21 0500  CBC  Daily,   R      12/16/21 0805   12/17/21 0500  Basic metabolic panel  Tomorrow morning,   R         12/16/21 0805   12/15/21 1951  Urinalysis, Routine w reflex microscopic  Once,   R        12/15/21 1950          Data Reviewed: I have personally reviewed following labs and imaging studies CBC: Recent Labs  Lab 12/15/21 0014 12/15/21 0035 12/16/21 0325  WBC 10.7*  --  9.0  NEUTROABS 9.3*  --   --   HGB 9.3* 9.9* 7.7*  HCT 28.3* 29.0* 23.8*  MCV 94.3  --  94.8  PLT 229  --  185   Basic Metabolic Panel: Recent Labs  Lab 12/15/21 0035 12/16/21 0325  NA 139 137  K 4.6 4.0  CL 106 106  CO2  --  22  GLUCOSE 129* 176*  BUN 40* 20  CREATININE 1.20* 0.72  CALCIUM  --  8.5*   GFR: Estimated Creatinine Clearance: 34.8 mL/min (by C-G formula based on SCr of 0.72 mg/dL). Liver Function Tests: No results for input(s): "AST", "ALT", "ALKPHOS", "BILITOT", "PROT", "ALBUMIN" in the last 168 hours. No results for input(s): "LIPASE", "AMYLASE" in the last 168 hours. No results for input(s): "AMMONIA" in the last 168 hours. Coagulation Profile: No results for input(s): "INR", "PROTIME" in the last 168 hours. BNP (last 3 results) No results for input(s): "PROBNP" in the last 8760 hours. HbA1C: No results for input(s): "HGBA1C" in the last 72 hours. CBG: No results for input(s): "GLUCAP" in the last 168 hours. Lipid Profile: No results for input(s): "CHOL", "HDL", "LDLCALC", "TRIG", "CHOLHDL", "LDLDIRECT" in the last 72 hours. Thyroid Function Tests: No results for input(s): "TSH", "T4TOTAL", "FREET4", "T3FREE", "THYROIDAB" in the last 72 hours. Sepsis Labs: No results for input(s): "PROCALCITON", "LATICACIDVEN" in the last 168 hours.  Recent Results (from the past 240 hour(s))  Surgical pcr screen     Status: None   Collection Time: 12/15/21  3:39 AM   Specimen: Nasal Mucosa; Nasal Swab  Result Value Ref Range Status   MRSA, PCR NEGATIVE NEGATIVE Final   Staphylococcus aureus NEGATIVE NEGATIVE Final    Comment: (NOTE) The Xpert SA Assay (FDA approved for NASAL  specimens in patients 4 years of age and older), is one component of a comprehensive surveillance program. It is not intended to diagnose infection nor to guide or monitor treatment. Performed at [facility], [address], [place], [place] [address]     Antimicrobials: Anti-infectives (From admission, onward)    Start     Dose/Rate Route Frequency Ordered Stop   12/15/21 2300  ceFAZolin (ANCEF) IVPB 2g/100 mL premix        2 g 200 mL/hr over 30 Minutes Intravenous Every 6 hours 12/15/21 1950 12/16/21 0550   12/15/21 1723  vancomycin (VANCOCIN) powder  Status:  Discontinued          As needed 12/15/21 1723 12/15/21 1943   12/15/21 1600  ceFAZolin (ANCEF) IVPB 2g/100 mL premix        2 g 200 mL/hr over 30 Minutes Intravenous On call to O.R. 12/15/21 6226 12/15/21 1658      Culture/Microbiology No results found for: "SDES", "SPECREQUEST", "CULT", "REPTSTATUS"  Other culture-see note  Radiology Studies: DG HIP UNILAT WITH PELVIS 2-3 VIEWS LEFT  Result Date: 12/15/2021 CLINICAL DATA:  Postop. EXAM: DG HIP (WITH OR WITHOUT PELVIS) 2-3V LEFT COMPARISON:  Preoperative radiograph earlier today. FINDINGS: Short intramedullary rod with trans trochanteric and distal locking screw fixation of comminuted proximal femur fracture. Improved fracture alignment from earlier today. Recent postsurgical change includes air and edema in the soft tissues. Slight chronic deformity of the left pubic body. IMPRESSION: ORIF of comminuted proximal femur fracture with improved fracture alignment. Electronically Signed   By: Narda Rutherford M.D.   On: 12/15/2021 22:12   DG HIP UNILAT WITH PELVIS 2-3 VIEWS LEFT  Result Date: 12/15/2021 CLINICAL DATA:  [id number].  Left femur intramedullary nail fixation EXAM: DG HIP (WITH OR WITHOUT PELVIS) 2-3V LEFT COMPARISON:  X-ray left femur 12/15/2021 12:25 a.m. FINDINGS: Intraoperative intramedullary nail fixation of the proximal femur with improved  alignment of a intertrochanteric fracture. 12 low resolution intraoperative spot views of the proximal left femur were obtained. No new fracture visible on the limited views. No dislocation Total fluoroscopy time: 1 minute and 31 seconds Total radiation dose: 6.2 mGy IMPRESSION: Intraoperative intramedullary nail fixation of the proximal  femur with improved alignment of a intertrochanteric fracture. Electronically Signed   By: Tish Frederickson M.D.   On: 12/15/2021 19:33   DG C-Arm 1-60 Min-No Report  Result Date: 12/15/2021 Fluoroscopy was utilized by the requesting physician.  No radiographic interpretation.   DG C-Arm 1-60 Min-No Report  Result Date: 12/15/2021 Fluoroscopy was utilized by the requesting physician.  No radiographic interpretation.   CT Cervical Spine Wo Contrast  Result Date: 12/15/2021 CLINICAL DATA:  Fall EXAM: CT CERVICAL SPINE WITHOUT CONTRAST TECHNIQUE: Multidetector CT imaging of the cervical spine was performed without intravenous contrast. Multiplanar CT image reconstructions were also generated. RADIATION DOSE REDUCTION: This exam was performed according to the departmental dose-optimization program which includes automated exposure control, adjustment of the mA and/or kV according to patient size and/or use of iterative reconstruction technique. COMPARISON:  None Available. FINDINGS: Alignment: No subluxation Skull base and vertebrae: No acute fracture. No primary bone lesion or focal pathologic process. Soft tissues and spinal canal: No prevertebral fluid or swelling. No visible canal hematoma. Disc levels: Diffuse advanced degenerative disc and facet disease throughout the cervical spine. Upper chest: Biapical scarring.  No acute findings Other: None IMPRESSION: Diffuse advanced degenerative disc and facet disease. No acute bony abnormality. Electronically Signed   By: Charlett Nose M.D.   On: 12/15/2021 01:21   CT Head Wo Contrast  Result Date: 12/15/2021 CLINICAL DATA:   Fall EXAM: CT HEAD WITHOUT CONTRAST TECHNIQUE: Contiguous axial images were obtained from the base of the skull through the vertex without intravenous contrast. RADIATION DOSE REDUCTION: This exam was performed according to the departmental dose-optimization program which includes automated exposure control, adjustment of the mA and/or kV according to patient size and/or use of iterative reconstruction technique. COMPARISON:  None Available. FINDINGS: Brain: There is atrophy and chronic small vessel disease changes. No acute intracranial abnormality. Specifically, no hemorrhage, hydrocephalus, mass lesion, acute infarction, or significant intracranial injury. Vascular: No hyperdense vessel or unexpected calcification. Skull: No acute calvarial abnormality. Sinuses/Orbits: No acute findings Other: None IMPRESSION: Atrophy, chronic microvascular disease. No acute intracranial abnormality. Electronically Signed   By: Charlett Nose M.D.   On: 12/15/2021 01:19   DG Hip Unilat W or Wo Pelvis 2-3 Views Left  Result Date: 12/15/2021 CLINICAL DATA:  Fall, left hip pain, deformity EXAM: DG HIP (WITH OR WITHOUT PELVIS) 2-3V LEFT COMPARISON:  None FINDINGS: There is a left femoral intertrochanteric fracture. Varus angulation. No subluxation or dislocation. Hip joints and SI joints symmetric. Aortic atherosclerosis. IMPRESSION: Left femoral intertrochanteric fracture with varus angulation. Electronically Signed   By: Charlett Nose M.D.   On: 12/15/2021 00:40   DG Chest 1 View  Result Date: 12/15/2021 CLINICAL DATA:  Fall, hip fracture, preop EXAM: CHEST  1 VIEW COMPARISON:  None Available. FINDINGS: Heart is normal size. Right base atelectasis. Left lung clear. No effusions or pneumothorax. No acute bony abnormality. IMPRESSION: Right base atelectasis. Electronically Signed   By: Charlett Nose M.D.   On: 12/15/2021 00:39     LOS: 1 day   Lanae Boast, MD [facility]  12/16/2021, 2:32 PM

## 2021-12-17 DIAGNOSIS — S72002A Fracture of unspecified part of neck of left femur, initial encounter for closed fracture: Secondary | ICD-10-CM | POA: Diagnosis not present

## 2021-12-17 LAB — BASIC METABOLIC PANEL
Anion gap: 6 (ref 5–15)
BUN: 26 mg/dL — ABNORMAL HIGH (ref 8–23)
CO2: 27 mmol/L (ref 22–32)
Calcium: 8.9 mg/dL (ref 8.9–10.3)
Chloride: 107 mmol/L (ref 98–111)
Creatinine, Ser: 0.9 mg/dL (ref 0.44–1.00)
GFR, Estimated: >60 mL/min (ref >60)
Glucose, Bld: 115 mg/dL — ABNORMAL HIGH (ref 70–99)
Potassium: 4.5 mmol/L (ref 3.5–5.1)
Sodium: 140 mmol/L (ref 135–145)

## 2021-12-17 LAB — CBC
HCT: 21.9 % — ABNORMAL LOW (ref 36.0–46.0)
Hemoglobin: 7.3 g/dL — ABNORMAL LOW (ref 12.0–15.0)
MCH: 30.9 pg (ref 26.0–34.0)
MCHC: 33.3 g/dL (ref 30.0–36.0)
MCV: 92.8 fL (ref 80.0–100.0)
Platelets: 202 10*3/uL (ref 150–400)
RBC: 2.36 MIL/uL — ABNORMAL LOW (ref 3.87–5.11)
RDW: 12.4 % (ref 11.5–15.5)
WBC: 7.7 10*3/uL (ref 4.0–10.5)
nRBC: 0 % (ref 0.0–0.2)

## 2021-12-17 LAB — PREPARE RBC (CROSSMATCH)

## 2021-12-17 MED ORDER — SODIUM CHLORIDE 0.9% IV SOLUTION: Freq: Once | INTRAVENOUS | Status: AC

## 2021-12-17 NOTE — Progress Notes (Signed)
Orthopaedics Daily Progress Note   12/17/2021   6:32 AM  Anna Ward is a 88 y.o. female 2 Days Post-Op s/p INTRAMEDULLARY (IM) NAIL FEMORAL  Subjective Pain appropriately controlled.  Nausea improved.      Objective Vitals:   12/16/21 2123 12/17/21 0544  BP: (!) 143/63 (!) 151/68  Pulse: 95 85  Resp: 17 17  Temp: 98.3 F (36.8 C) 98.2 F (36.8 C)  SpO2: 97% 96%    Intake/Output Summary (Last 24 hours) at 12/17/2021 4383 Last data filed at 12/17/2021 0200 Gross per 24 hour  Intake 356.64 ml  Output 250 ml  Net 106.64 ml     Physical Exam LLE: Dressing clean, dry, and intact +DF/PF/EHL SILT SP/DP/T +DP/PT and WWP distally  Assessment 88 y.o. female s/p Procedure(s) (LRB): INTRAMEDULLARY (IM) NAIL FEMORAL (Left)  Plan BUE tremors: Evaluation by primary medical team Mobility: Out of bed with PT/OT Pain control: Continue to wean/titrate to appropriate oral regimen DVT Prophylaxis: Lovenox 40 mg daily x6 weeks Further surgical plans: None RUE: Weightbearing as tolerated, no restrictions LUE: Weightbearing as tolerated, no restrictions RLE: Weightbearing as tolerated, no restrictions LLE: Weightbearing as tolerated, no restrictions Dressing care: Keep AQUACEL on and dry for up to 14 days.  Do not allow surgical area to get wet before that.  Remove AQUACEL dressing after 14 days and allow area to get wet in shower but DO NOT SUBMERGE until wound is evaluated in clinic.  In most cases skin glue is used and no additional dressing is necessary.  Disposition: Per primary team as medically appropriate Follow-up: Please call [facility] [phone]) to schedule follow-op appointment for 2 weeks after surgery.   Ernestina Columbia M.D. Orthopaedic Surgery [facility]

## 2021-12-17 NOTE — Plan of Care (Signed)
  Problem: Nutrition: Goal: Adequate nutrition will be maintained Outcome: Progressing   

## 2021-12-17 NOTE — Progress Notes (Signed)
Physical Therapy Treatment Patient Details Name: Anna Ward MRN: [id number] DOB: 10/15/1931 Today's Date: 12/17/2021   History of Present Illness 88 y.o. female admitted 12/15/21 with a fall in which she sustained a Left femoral intertrochanteric fracture , s/p IM nail. PMH: osteoporosis.    PT Comments    POD # 2 pm session AM session withheld due to HgB 7.3 pt scheduled to receive blood.    Assisted pt OOB required increased time.  General bed mobility comments: min A for L LE off bed with increased time.  General transfer comment: VCs for hand placment, min A to power up and safety with turns.  Assisted from elevated bed to [facility] then from [facility] to amb. General Gait Details: 25% VC's on proper upright posture, proper walker to self distance and 50% VC's on safety with turns.  Tolerated an increased distance with no issues of dizziness. Mild gait instability.  Unsteady with turns and back steps.  HIGH FALL RISK. Returned to room and performed a few TE's followed by ICE. Pt lives home alone and will need ST Rehab at SNF prior to safe return.   Recommendations for follow up therapy are one component of a multi-disciplinary discharge planning process, led by the attending physician.  Recommendations may be updated based on patient status, additional functional criteria and insurance authorization.  Follow Up Recommendations  Skilled nursing-short term rehab (<3 hours/day) Can patient physically be transported by private vehicle: Yes   Assistance Recommended at Discharge Intermittent Supervision/Assistance  Patient can return home with the following A lot of help with bathing/dressing/bathroom;A little help with walking and/or transfers;Assistance with cooking/housework;Assist for transportation   Equipment Recommendations  Rolling walker (2 wheels)    Recommendations for Other Services       Precautions / Restrictions Precautions Precautions: Fall Precaution Comments: h/o  vertigo Restrictions Weight Bearing Restrictions: No     Mobility  Bed Mobility Overal bed mobility: Needs Assistance Bed Mobility: Supine to Sit     Supine to sit: Min assist     General bed mobility comments: min A for L LE off bed with increased time    Transfers Overall transfer level: Needs assistance Equipment used: Rolling walker (2 wheels) Transfers: Sit to/from Stand Sit to Stand: Min assist, From elevated surface           General transfer comment: VCs for hand placment, min A to power up and safety with turns.  Assisted from elevated bed to [facility] then from [facility] to amb.    Ambulation/Gait Ambulation/Gait assistance: Min assist, Min guard Gait Distance (Feet): 32 Feet Assistive device: Rolling walker (2 wheels) Gait Pattern/deviations: Step-to pattern, Decreased step length - right, Decreased step length - left Gait velocity: decreased     General Gait Details: 25% VC's on proper upright posture, proper walker to self distance and 50% VC's on safety with turns.  Tolerated an increased distance with no issues of dizziness. Mild gait instability.  Unsteady with turns and back steps.  HIGH FALL RISK.   Stairs             Wheelchair Mobility    Modified Rankin (Stroke Patients Only)       Balance                                            Cognition Arousal/Alertness: Awake/alert Behavior During Therapy: WFL for tasks  assessed/performed Overall Cognitive Status: Within Functional Limits for tasks assessed                                 General Comments: AxO x 3 very pleasant Lady, [facility], motivated        Exercises  10 resp AP, knee presses and gluteal squeezes    General Comments        Pertinent Vitals/Pain Pain Assessment Pain Assessment: Faces Faces Pain Scale: Hurts a little bit Pain Location: L hip "not too bad" Pain Descriptors / Indicators: Aching, Tender, Operative site guarding Pain  Intervention(s): Monitored during session, Premedicated before session, Repositioned, Ice applied    Home Living                          Prior Function            PT Goals (current goals can now be found in the care plan section) Progress towards PT goals: Progressing toward goals    Frequency    Min 3X/week      PT Plan Current plan remains appropriate    Co-evaluation              AM-PAC PT "6 Clicks" Mobility   Outcome Measure  Help needed turning from your back to your side while in a flat bed without using bedrails?: A Little Help needed moving from lying on your back to sitting on the side of a flat bed without using bedrails?: A Little Help needed moving to and from a bed to a chair (including a wheelchair)?: A Little Help needed standing up from a chair using your arms (e.g., wheelchair or bedside chair)?: A Little Help needed to walk in hospital room?: A Little Help needed climbing 3-5 steps with a railing? : A Lot 6 Click Score: 17    End of Session Equipment Utilized During Treatment: Gait belt Activity Tolerance: Patient tolerated treatment well Patient left: in chair;with chair alarm set;with call bell/phone within reach Nurse Communication: Mobility status PT Visit Diagnosis: Difficulty in walking, not elsewhere classified (R26.2)     Time:  -15:46 - 16:18    Charges:   1 gt    1 ta                     Felecia Shelling  PTA Acute  Rehabilitation Services Office M-F          [phone] Weekend pager [phone]

## 2021-12-17 NOTE — TOC Progression Note (Signed)
Transition of Care [facility]) - Progression Note   Patient Details  Name: Anna Ward MRN: [id number] Date of Birth: [birth year]  [facility] CM/SW Contact  Ewing Schlein, LCSW Phone Number: 12/17/2021, 11:35 AM  Clinical Narrative: Patient has bed offers and family's choice is [facility]. CSW confirmed bed with Tresa Endo in admissions at [facility]. Patient is expected to be medically stable for discharge tomorrow.  CSW spoke with patient's son, Onalee Hua, to explain the process for rehab approval.  CSW completed insurance authorization on [employer] portal. Reference ID # is: [id number]. Patient has been approved for 12/18/2021-12/23/2021.  CSW spoke with patient's son, Shambhavi Salley, regarding insurance approval.  Expected Discharge Plan: Skilled Nursing Facility Barriers to Discharge: Continued Medical Work up  Expected Discharge Plan and Services Expected Discharge Plan: Skilled Nursing Facility In-house Referral: Clinical Social Work Discharge Planning Services: NA Post Acute Care Choice: Skilled Nursing Facility Living arrangements for the past 2 months: Apartment           DME Arranged: N/A DME Agency: NA  Readmission Risk Interventions     No data to display

## 2021-12-17 NOTE — Progress Notes (Signed)
PROGRESS NOTE Anna Ward  JOI:[id number] DOB: [birth year] DOA: 12/15/2021 PCP: Sharon Seller, NP   Brief Narrative/Hospital Course: 88 y.o.f w/ significant history of chronic low back pain, osteoporosis presented with left hip pain after a fall. In her kitchen she felt dizzy for a second -- she apparently has intermittent bouts of vertigo. When she tried to catch herself on her counter, she missed and fell to the floor on her left side.Seen in the ED found to have left hip fracture orthopedic was consulted.  Was hemodynamically stable, labs showed leukocytosis 10.7, anemia w/ hb in 9s gm.  Patient underwent IM femoral nail of the left hip by Dr.Looney 12/15/21 12/16/21 She remained afebrile, creatinine improved to 0.7 from 1.2 indicating AKI that has resolved, hemoglobin decreased to 7.7 g indicating acute blood loss anemia in the setting of hip fracture and surgery, and also from hemodilution    Subjective: Seen this am pain controlled, wants to take shower Overnight afebrile, hemoglobin is further downtrending 7.3 g Renal function is stable urine was dark but no evidence of UTI  Assessment and Plan: Principal Problem:   Closed left hip fracture (HCC) Active Problems:   AKI (acute kidney injury) (HCC)   Normocytic anemia   Fall at home, initial encounter   Leukocytosis   Malnutrition of moderate degree  Closed left hip fracture after a fall at home in the setting of osteoporosis: s/p IM nailing done 6/27.  Continue PT OT, WBAT as tolerated, Aquacel dressing x14 days and outpatient follow-up for wound care.  Continue to skilled nursing facility placement once available.  Continue pain control, DVT prophylaxis-on Lovenox and bowel regimen as per orthopedics.  AKI: Resolved, wean off IV fluids   Normocytic anemia Acute blood loss anemia: Anemia in the setting of hemodilution as well as blood loss in the setting of hip fracture and surgery.  Iron 22, add iron supplement on d/c-Aten is  further downtrending and symptomatic with weakness.  Will transfuse 1 unit PRBC patient has agreed for transfusion after extremities benefits alternatives . Recent Labs  Lab 12/15/21 0014 12/15/21 0035 12/16/21 0325 12/17/21 0334  HGB 9.3* 9.9* 7.7* 7.3*  HCT 28.3* 29.0* 23.8* 21.9*    Leukocytosis: Resolved  Malnutrition of moderate degree:augment nutrition status per dietitian Nutrition Problem: Moderate Malnutrition Etiology: social / environmental circumstances (lives alone) Signs/Symptoms: moderate fat depletion, moderate muscle depletion, severe muscle depletion Interventions: Ensure Enlive (each supplement provides 350kcal and 20 grams of protein)   DVT prophylaxis: enoxaparin (LOVENOX) injection 30 mg Start: 12/16/21 0800 SCDs Start: 12/15/21 1951 Code Status:   Code Status: Full Code Family Communication: plan of care discussed with patient at bedside.Son was called and updated. Patient status is: Inpatient because of ongoing postop management following hip surgery Level of care: Telemetry   Dispo: The patient is from: home            Anticipated disposition: snf anticipated  in next 24 hrs  Mobility Assessment (last 72 hours)     Mobility Assessment     Row Name 12/16/21 2300 12/16/21 1410 12/16/21 1256 12/16/21 0830 12/15/21 1035   Does patient have an order for bedrest or is patient medically unstable No - Continue assessment -- -- No - Continue assessment No - Continue assessment   What is the highest level of mobility based on the progressive mobility assessment? Level 5 (Walks with assist in room/hall) - Balance while stepping forward/back and can walk in room with assist - Complete Level 5 (Walks  with assist in room/hall) - Balance while stepping forward/back and can walk in room with assist - Complete Level 5 (Walks with assist in room/hall) - Balance while stepping forward/back and can walk in room with assist - Complete Level 3 (Stands with assist) - Balance while  standing  and cannot march in place Level 1 (Bedfast) - Unable to balance while sitting on edge of bed   Is the above level different from baseline mobility prior to current illness? Yes - Recommend PT order -- -- Yes - Recommend PT order Yes - Recommend PT order  surgery and post-op PT pending    Row Name 12/15/21 0324           Does patient have an order for bedrest or is patient medically unstable No - Continue assessment       What is the highest level of mobility based on the progressive mobility assessment? Level 1 (Bedfast) - Unable to balance while sitting on edge of bed       Is the above level different from baseline mobility prior to current illness? Yes - Recommend PT order               Objective: Vitals last 24 hrs: Vitals:   12/16/21 1223 12/16/21 1453 12/16/21 2123 12/17/21 0544  BP: 108/61 (!) 133/56 (!) 143/63 (!) 151/68  Pulse: 84 95 95 85  Resp:  18 17 17   Temp:  98 F (36.7 C) 98.3 F (36.8 C) 98.2 F (36.8 C)  TempSrc:  Oral Oral Oral  SpO2: 99% 100% 97% 96%  Weight:      Height:       Weight change:   Physical Examination: General exam: AAo, hard of hearing older than stated age, weak appearing. HEENT:Oral mucosa moist, Ear/Nose WNL grossly, dentition normal. Respiratory system: bilaterally diminished, no use of accessory muscle Cardiovascular system: S1 & S2 +, No JVD,. Gastrointestinal system: Abdomen soft,NT,ND,BS+ Nervous System:Alert, awake, moving extremities and grossly nonfocal Extremities: LE ankle edema neg, leg hip surgical site with aquacel dressing + c/d/I Skin: No rashes,no icterus. MSK: Normal muscle bulk,tone, power   Medications reviewed:  Scheduled Meds:  sodium chloride   Intravenous Once   docusate sodium  100 mg Oral BID   enoxaparin (LOVENOX) injection  30 mg Subcutaneous Q24H   feeding supplement  237 mL Oral BID BM   Continuous Infusions:    Diet Order             Diet regular Room service appropriate? Yes;  Fluid consistency: Thin  Diet effective now                   Intake/Output Summary (Last 24 hours) at 12/17/2021 0911 Last data filed at 12/17/2021 12/19/2021 Gross per 24 hour  Intake 356.64 ml  Output 550 ml  Net -193.36 ml   Net IO Since Admission: 4,001.85 mL [12/17/21 0911]  Wt Readings from Last 3 Encounters:  12/15/21 44.5 kg  02/20/21 44.5 kg  02/16/21 45.1 kg     Unresulted Labs (From admission, onward)     Start     Ordered   12/17/21 0911  Prepare RBC (crossmatch)  (Adult Blood Administration - Red Blood Cells)  Once,   R       Question Answer Comment  # of Units 1 unit   Transfusion Indications Symptomatic Anemia   Transfusion Indications Actively Bleeding / GI Bleed   Number of Units to Keep Ahead NO units ahead  If emergent release call blood bank Not emergent release      12/17/21 0910   12/17/21 0500  CBC  Daily,   R      12/16/21 0805          Data Reviewed: I have personally reviewed following labs and imaging studies CBC: Recent Labs  Lab 12/15/21 0014 12/15/21 0035 12/16/21 0325 12/17/21 0334  WBC 10.7*  --  9.0 7.7  NEUTROABS 9.3*  --   --   --   HGB 9.3* 9.9* 7.7* 7.3*  HCT 28.3* 29.0* 23.8* 21.9*  MCV 94.3  --  94.8 92.8  PLT 229  --  185 202   Basic Metabolic Panel: Recent Labs  Lab 12/15/21 0035 12/16/21 0325 12/17/21 0334  NA 139 137 140  K 4.6 4.0 4.5  CL 106 106 107  CO2  --  22 27  GLUCOSE 129* 176* 115*  BUN 40* 20 26*  CREATININE 1.20* 0.72 0.90  CALCIUM  --  8.5* 8.9   GFR: Estimated Creatinine Clearance: 30.9 mL/min (by C-G formula based on SCr of 0.9 mg/dL). Liver Function Tests: No results for input(s): "AST", "ALT", "ALKPHOS", "BILITOT", "PROT", "ALBUMIN" in the last 168 hours. No results for input(s): "LIPASE", "AMYLASE" in the last 168 hours. No results for input(s): "AMMONIA" in the last 168 hours. Coagulation Profile: No results for input(s): "INR", "PROTIME" in the last 168 hours. BNP (last 3  results) No results for input(s): "PROBNP" in the last 8760 hours. HbA1C: No results for input(s): "HGBA1C" in the last 72 hours. CBG: No results for input(s): "GLUCAP" in the last 168 hours. Lipid Profile: No results for input(s): "CHOL", "HDL", "LDLCALC", "TRIG", "CHOLHDL", "LDLDIRECT" in the last 72 hours. Thyroid Function Tests: No results for input(s): "TSH", "T4TOTAL", "FREET4", "T3FREE", "THYROIDAB" in the last 72 hours. Sepsis Labs: No results for input(s): "PROCALCITON", "LATICACIDVEN" in the last 168 hours.  Recent Results (from the past 240 hour(s))  Surgical pcr screen     Status: None   Collection Time: 12/15/21  3:39 AM   Specimen: Nasal Mucosa; Nasal Swab  Result Value Ref Range Status   MRSA, PCR NEGATIVE NEGATIVE Final   Staphylococcus aureus NEGATIVE NEGATIVE Final    Comment: (NOTE) The Xpert SA Assay (FDA approved for NASAL specimens in patients 59 years of age and older), is one component of a comprehensive surveillance program. It is not intended to diagnose infection nor to guide or monitor treatment. Performed at [facility], [address] [place], [place] [address]     Antimicrobials: Anti-infectives (From admission, onward)    Start     Dose/Rate Route Frequency Ordered Stop   12/15/21 2300  ceFAZolin (ANCEF) IVPB 2g/100 mL premix        2 g 200 mL/hr over 30 Minutes Intravenous Every 6 hours 12/15/21 1950 12/16/21 0550   12/15/21 1723  vancomycin (VANCOCIN) powder  Status:  Discontinued          As needed 12/15/21 1723 12/15/21 1943   12/15/21 1600  ceFAZolin (ANCEF) IVPB 2g/100 mL premix        2 g 200 mL/hr over 30 Minutes Intravenous On call to O.R. 12/15/21 2595 12/15/21 1658      Culture/Microbiology No results found for: "SDES", "SPECREQUEST", "CULT", "REPTSTATUS"  Other culture-see note  Radiology Studies: DG HIP UNILAT WITH PELVIS 2-3 VIEWS LEFT  Result Date: 12/15/2021 CLINICAL DATA:  Postop. EXAM: DG HIP  (WITH OR WITHOUT PELVIS) 2-3V LEFT COMPARISON:  Preoperative radiograph earlier today. FINDINGS: Short intramedullary rod with trans trochanteric and distal locking screw fixation of comminuted proximal femur fracture. Improved fracture alignment from earlier today. Recent postsurgical change includes air and edema in the soft tissues. Slight chronic deformity of the left pubic body. IMPRESSION: ORIF of comminuted proximal femur fracture with improved fracture alignment. Electronically Signed   By: Narda Rutherford M.D.   On: 12/15/2021 22:12   DG HIP UNILAT WITH PELVIS 2-3 VIEWS LEFT  Result Date: 12/15/2021 CLINICAL DATA:  [id number].  Left femur intramedullary nail fixation EXAM: DG HIP (WITH OR WITHOUT PELVIS) 2-3V LEFT COMPARISON:  X-ray left femur 12/15/2021 12:25 a.m. FINDINGS: Intraoperative intramedullary nail fixation of the proximal femur with improved alignment of a intertrochanteric fracture. 12 low resolution intraoperative spot views of the proximal left femur were obtained. No new fracture visible on the limited views. No dislocation Total fluoroscopy time: 1 minute and 31 seconds Total radiation dose: 6.2 mGy IMPRESSION: Intraoperative intramedullary nail fixation of the proximal femur with improved alignment of a intertrochanteric fracture. Electronically Signed   By: Tish Frederickson M.D.   On: 12/15/2021 19:33   DG C-Arm 1-60 Min-No Report  Result Date: 12/15/2021 Fluoroscopy was utilized by the requesting physician.  No radiographic interpretation.   DG C-Arm 1-60 Min-No Report  Result Date: 12/15/2021 Fluoroscopy was utilized by the requesting physician.  No radiographic interpretation.     LOS: 2 days   Lanae Boast, MD [facility]  12/17/2021, 9:11 AM

## 2021-12-18 DIAGNOSIS — S72002A Fracture of unspecified part of neck of left femur, initial encounter for closed fracture: Secondary | ICD-10-CM | POA: Diagnosis not present

## 2021-12-18 LAB — CBC
HCT: 27 % — ABNORMAL LOW (ref 36.0–46.0)
Hemoglobin: 9 g/dL — ABNORMAL LOW (ref 12.0–15.0)
MCH: 30.5 pg (ref 26.0–34.0)
MCHC: 33.3 g/dL (ref 30.0–36.0)
MCV: 91.5 fL (ref 80.0–100.0)
Platelets: 205 10*3/uL (ref 150–400)
RBC: 2.95 MIL/uL — ABNORMAL LOW (ref 3.87–5.11)
RDW: 13.1 % (ref 11.5–15.5)
WBC: 7.5 10*3/uL (ref 4.0–10.5)
nRBC: 0 % (ref 0.0–0.2)

## 2021-12-18 LAB — TYPE AND SCREEN
ABO/RH(D): B POS
Antibody Screen: NEGATIVE
Unit division: 0

## 2021-12-18 LAB — BPAM RBC
Blood Product Expiration Date: 202307242359
ISSUE DATE / TIME: 202306291232
Unit Type and Rh: 7300

## 2021-12-18 MED ORDER — ENOXAPARIN SODIUM 30 MG/0.3ML IJ SOSY: 30.0000 mg | PREFILLED_SYRINGE | INTRAMUSCULAR | Status: AC

## 2021-12-18 MED ORDER — POLYETHYLENE GLYCOL 3350 17 G PO PACK
17.0000 g | PACK | Freq: Every day | ORAL | Status: DC
  Administered 2021-12-18: 17 g via ORAL
  Filled 2021-12-18: qty 1

## 2021-12-18 MED ORDER — POLYETHYLENE GLYCOL 3350 17 G PO PACK: 17.0000 g | PACK | Freq: Every day | ORAL | 0 refills | Status: AC | PRN

## 2021-12-18 MED ORDER — HYDROCODONE-ACETAMINOPHEN 5-325 MG PO TABS
1.0000 | ORAL_TABLET | Freq: Three times a day (TID) | ORAL | 0 refills | Status: AC | PRN
Start: 2021-12-18 — End: ?

## 2021-12-18 MED ORDER — DOCUSATE SODIUM 100 MG PO CAPS: 100.0000 mg | ORAL_CAPSULE | Freq: Two times a day (BID) | ORAL | 0 refills | Status: AC

## 2021-12-18 MED ORDER — FERROUS SULFATE 325 (65 FE) MG PO TABS: 325.0000 mg | ORAL_TABLET | Freq: Every day | ORAL | 0 refills | Status: AC

## 2021-12-18 MED ORDER — ENSURE ENLIVE PO LIQD: 237.0000 mL | Freq: Two times a day (BID) | ORAL | 0 refills | Status: AC

## 2021-12-18 NOTE — TOC Transition Note (Signed)
Transition of Care [facility]) - CM/SW Discharge Note  Patient Details  Name: [name] MRN: [id number] Date of Birth: 02-03-1932  Transition of Care [facility]) CM/SW Contact:  Ewing Schlein, LCSW Phone Number: 12/18/2021, 11:21 AM  Clinical Narrative: Patient is medically stable for discharge to SNF. Patient will go to room 103A and the number for report is [phone]. Discharge summary, discharge orders, and SNF transfer report faxed to facility in hub. Medical necessity form done; PTAR scheduled. Discharge packet completed. CSW updated patient and son, Shantoya Geurts, regarding discharge and transportation being set up. RN notified. TOC signing off.  Final next level of care: Skilled Nursing Facility Barriers to Discharge: Barriers Resolved  Patient Goals and CMS Choice Patient states their goals for this hospitalization and ongoing recovery are:: Get better CMS Medicare.gov Compare Post Acute Care list provided to:: Patient Choice offered to / list presented to : Patient, Adult Children  Discharge Placement PASRR number recieved: 12/16/21       Patient chooses bed at: [facility] Patient to be transferred to facility by: PTAR Name of family member notified: Cherilyn Sautter Patient and family notified of of transfer: 12/18/21  Discharge Plan and Services In-house Referral: Clinical Social Work Discharge Planning Services: NA Post Acute Care Choice: Skilled Nursing Facility          DME Arranged: N/A DME Agency: NA  Readmission Risk Interventions     No data to display

## 2021-12-18 NOTE — Progress Notes (Signed)
Orthopaedics Daily Progress Note   12/18/2021   8:28 AM  Anna Ward is a 88 y.o. female 3 Days Post-Op s/p INTRAMEDULLARY (IM) NAIL FEMORAL  Subjective Pain appropriately controlled.  Resting comfortably, sound asleep.   Objective Vitals:   12/17/21 2110 12/18/21 0540  BP: (!) 148/59 (!) 146/72  Pulse: 100 91  Resp: 16 16  Temp: 98.2 F (36.8 C) 98.1 F (36.7 C)  SpO2: 95% 93%    Intake/Output Summary (Last 24 hours) at 12/18/2021 6967 Last data filed at 12/18/2021 0542 Gross per 24 hour  Intake 388.17 ml  Output 600 ml  Net -211.83 ml     Physical Exam Resting comfortably NLB LLE: Dressing clean, dry, and intact +DP/PT and WWP distally  Assessment 88 y.o. female s/p Procedure(s) (LRB): INTRAMEDULLARY (IM) NAIL FEMORAL (Left)  Plan BUE tremors: Evaluation by primary medical team Mobility: Out of bed with PT/OT Pain control: Continue to wean/titrate to appropriate oral regimen DVT Prophylaxis: Lovenox 40 mg daily x6 weeks Further surgical plans: None RUE: Weightbearing as tolerated, no restrictions LUE: Weightbearing as tolerated, no restrictions RLE: Weightbearing as tolerated, no restrictions LLE: Weightbearing as tolerated, no restrictions Dressing care: Keep AQUACEL on and dry for up to 14 days.  Do not allow surgical area to get wet before that.  Remove AQUACEL dressing after 14 days and allow area to get wet in shower but DO NOT SUBMERGE until wound is evaluated in clinic.  In most cases skin glue is used and no additional dressing is necessary.  Disposition: Per primary team as medically appropriate Follow-up: Please call [facility] [phone]) to schedule follow-op appointment for 2 weeks after surgery.   Ernestina Columbia M.D. Orthopaedic Surgery [facility]

## 2021-12-18 NOTE — Discharge Summary (Signed)
Physician Discharge Summary  Anna Ward ZOX:[id number] DOB: 08-24-1931 DOA: 12/15/2021  PCP: Sharon Seller, NP  Admit date: 12/15/2021 Discharge date: 12/18/2021 Recommendations for Outpatient Follow-up:  Follow up with PCP in 1 weeks-call for appointment Please obtain BMP/CBC in one week Follow-up with orthopedics 2 weeks postop per wound care  Discharge Dispo: SNF Discharge Condition: Stable Code Status:   Code Status: Full Code Diet recommendation:  Diet Order             Diet regular Room service appropriate? Yes; Fluid consistency: Thin  Diet effective now                 Brief/Interim Summary: 88 y.o.f w/ significant history of chronic low back pain, osteoporosis presented with left hip pain after a fall. In her kitchen she felt dizzy for a second -- she apparently has intermittent bouts of vertigo. When she tried to catch herself on her counter, she missed and fell to the floor on her left side.Seen in the ED found to have left hip fracture orthopedic was consulted.  Was hemodynamically stable, labs showed leukocytosis 10.7, anemia w/ hb in 9s gm.  Patient underwent IM femoral nail of the left hip by Dr.Looney 12/15/21 12/16/21 She remained afebrile, creatinine improved to 0.7 from 1.2 indicating AKI that has resolved, hemoglobin decreased to 7.7 g indicating acute blood loss anemia in the setting of hip fracture and surgery, and also from hemodilution.  Hemoglobin improved to 9 g after transfusion.  Remains afebrile hemodynamically stable, and will be discharged to skilled nursing facility for further PT OT   Discharge Diagnoses:  Principal Problem:   Closed left hip fracture [facility]) Active Problems:   AKI (acute kidney injury) (HCC)   Normocytic anemia   Fall at home, initial encounter   Leukocytosis   Malnutrition of moderate degree  Closed left hip fracture after a fall at home in the setting of osteoporosis: s/p IM nailing done 6/27.  Continue PT OT, WBAT as  tolerated, Aquacel dressing x14 days and outpatient follow-up for wound care.  Continue to skilled nursing facility placement once available.  Continue pain control, DVT prophylaxis-on Lovenox - Ortho advised for 6 wks total, cont stool softeners and laxatives with pain meds   AKI: Resolved, weaned off IV fluids    Normocytic anemia Acute blood loss anemia: Anemia in the setting of hemodilution as well as blood loss in the setting of hip fracture and surgery.  Iron 22, add iron supplement on d/c-s/p 1 unit PRBC. Hb stable now.  Check CBC in 1 week Recent Labs  Lab 12/15/21 0014 12/15/21 0035 12/16/21 0325 12/17/21 0334 12/18/21 0344  HGB 9.3* 9.9* 7.7* 7.3* 9.0*  HCT 28.3* 29.0* 23.8* 21.9* 27.0*    Leukocytosis: Resolved   Malnutrition of moderate degree:augment nutrition status per dietitian Nutrition Problem: Moderate Malnutrition Etiology: social / environmental circumstances (lives alone) Signs/Symptoms: moderate fat depletion, moderate muscle depletion, severe muscle depletion Interventions: Ensure Enlive (each supplement provides 350kcal and 20 grams of protein)  Consults: ortho Subjective: Afebrile overnight, mobilizing well with PT OT  Discharge Exam: Vitals:   12/17/21 2110 12/18/21 0540  BP: (!) 148/59 (!) 146/72  Pulse: 100 91  Resp: 16 16  Temp: 98.2 F (36.8 C) 98.1 F (36.7 C)  SpO2: 95% 93%   General: Pt is alert, awake, not in acute distress Cardiovascular: RRR, S1/S2 +, no rubs, no gallops Respiratory: CTA bilaterally, no wheezing, no rhonchi Abdominal: Soft, NT, ND, bowel sounds + Extremities:  no edema, no cyanosis  Discharge Instructions  Discharge Instructions     Discharge instructions   Complete by: As directed    Fu Dr Sherilyn Dacosta 2 wk post op Cbc bmp in 1 wk  Please call call MD or return to ER for similar or worsening recurring problem that brought you to hospital or if any fever,nausea/vomiting,abdominal pain, uncontrolled pain, chest  pain,  shortness of breath or any other alarming symptoms.  Please follow-up your doctor as instructed in a week time and call the office for appointment.  Please avoid alcohol, smoking, or any other illicit substance and maintain healthy habits including taking your regular medications as prescribed.  You were cared for by a hospitalist during your hospital stay. If you have any questions about your discharge medications or the care you received while you were in the hospital after you are discharged, you can call the unit and ask to speak with the hospitalist on call if the hospitalist that took care of you is not available.  Once you are discharged, your primary care physician will handle any further medical issues. Please note that NO REFILLS for any discharge medications will be authorized once you are discharged, as it is imperative that you return to your primary care physician (or establish a relationship with a primary care physician if you do not have one) for your aftercare needs so that they can reassess your need for medications and monitor your lab values   Discharge wound care:   Complete by: As directed    Continue Aquacel dressing x14 days postop, see the detailed wound care instruction from orthopedics   Increase activity slowly   Complete by: As directed       Allergies as of 12/18/2021   No Known Allergies      Medication List     TAKE these medications    acetaminophen 500 MG tablet Commonly known as: TYLENOL Take 1,000 mg by mouth every 6 (six) hours as needed for mild pain.   Advil PM 200-38 MG Tabs Generic drug: Ibuprofen-diphenhydrAMINE Cit Take 1 tablet by mouth at bedtime as needed (sleep).   CALCIUM PO Take 1 tablet by mouth daily.   docusate sodium 100 MG capsule Commonly known as: COLACE Take 1 capsule (100 mg total) by mouth 2 (two) times daily.   enoxaparin 30 MG/0.3ML injection Commonly known as: LOVENOX Inject 0.3 mLs (30 mg total) into the  skin daily. Start taking on: December 19, 2021   EYE DROPS OP Place 1 drop into both eyes daily as needed (dry eyes).   feeding supplement Liqd Take 237 mLs by mouth 2 (two) times daily between meals.   ferrous sulfate 325 (65 FE) MG tablet Take 1 tablet (325 mg total) by mouth daily.   HYDROcodone-acetaminophen 5-325 MG tablet Commonly known as: NORCO/VICODIN Take 1 tablet by mouth every 8 (eight) hours as needed for up to 6 doses for moderate pain.   multivitamin tablet Take 1 tablet by mouth daily.   polyethylene glycol 17 g packet Commonly known as: MIRALAX / GLYCOLAX Take 17 g by mouth daily as needed.   VITAMIN C PO Take 1 tablet by mouth daily.               Discharge Care Instructions  (From admission, onward)           Start     Ordered   12/18/21 0000  Discharge wound care:       Comments: Continue Aquacel  dressing x14 days postop, see the detailed wound care instruction from orthopedics   12/18/21 0910            Contact information for follow-up providers     Ernestina Columbia, MD. Schedule an appointment as soon as possible for a visit in 2 week(s).   Specialty: Orthopedic Surgery Contact information: [address] [place] [address] [phone]              Contact information for after-discharge care     Destination     [facility] .   Service: Skilled Nursing Contact information: [address] [place] [address] [phone]                    No Known Allergies  The results of significant diagnostics from this hospitalization (including imaging, microbiology, ancillary and laboratory) are listed below for reference.    Microbiology: Recent Results (from the past 240 hour(s))  Surgical pcr screen     Status: None   Collection Time: 12/15/21  3:39 AM   Specimen: Nasal Mucosa; Nasal Swab  Result Value Ref Range Status   MRSA, PCR NEGATIVE NEGATIVE Final    Staphylococcus aureus NEGATIVE NEGATIVE Final    Comment: (NOTE) The Xpert SA Assay (FDA approved for NASAL specimens in patients 60 years of age and older), is one component of a comprehensive surveillance program. It is not intended to diagnose infection nor to guide or monitor treatment. Performed at [facility], [address], [place], [place] [address]     Procedures/Studies: DG HIP UNILAT WITH PELVIS 2-3 VIEWS LEFT  Result Date: 12/15/2021 CLINICAL DATA:  Postop. EXAM: DG HIP (WITH OR WITHOUT PELVIS) 2-3V LEFT COMPARISON:  Preoperative radiograph earlier today. FINDINGS: Short intramedullary rod with trans trochanteric and distal locking screw fixation of comminuted proximal femur fracture. Improved fracture alignment from earlier today. Recent postsurgical change includes air and edema in the soft tissues. Slight chronic deformity of the left pubic body. IMPRESSION: ORIF of comminuted proximal femur fracture with improved fracture alignment. Electronically Signed   By: Narda Rutherford M.D.   On: 12/15/2021 22:12   DG HIP UNILAT WITH PELVIS 2-3 VIEWS LEFT  Result Date: 12/15/2021 CLINICAL DATA:  [id number].  Left femur intramedullary nail fixation EXAM: DG HIP (WITH OR WITHOUT PELVIS) 2-3V LEFT COMPARISON:  X-ray left femur 12/15/2021 12:25 a.m. FINDINGS: Intraoperative intramedullary nail fixation of the proximal femur with improved alignment of a intertrochanteric fracture. 12 low resolution intraoperative spot views of the proximal left femur were obtained. No new fracture visible on the limited views. No dislocation Total fluoroscopy time: 1 minute and 31 seconds Total radiation dose: 6.2 mGy IMPRESSION: Intraoperative intramedullary nail fixation of the proximal femur with improved alignment of a intertrochanteric fracture. Electronically Signed   By: Tish Frederickson M.D.   On: 12/15/2021 19:33   DG C-Arm 1-60 Min-No Report  Result Date: 12/15/2021 Fluoroscopy was  utilized by the requesting physician.  No radiographic interpretation.   DG C-Arm 1-60 Min-No Report  Result Date: 12/15/2021 Fluoroscopy was utilized by the requesting physician.  No radiographic interpretation.   CT Cervical Spine Wo Contrast  Result Date: 12/15/2021 CLINICAL DATA:  Fall EXAM: CT CERVICAL SPINE WITHOUT CONTRAST TECHNIQUE: Multidetector CT imaging of the cervical spine was performed without intravenous contrast. Multiplanar CT image reconstructions were also generated. RADIATION DOSE REDUCTION: This exam was performed according to the departmental dose-optimization program which includes automated exposure control,  adjustment of the mA and/or kV according to patient size and/or use of iterative reconstruction technique. COMPARISON:  None Available. FINDINGS: Alignment: No subluxation Skull base and vertebrae: No acute fracture. No primary bone lesion or focal pathologic process. Soft tissues and spinal canal: No prevertebral fluid or swelling. No visible canal hematoma. Disc levels: Diffuse advanced degenerative disc and facet disease throughout the cervical spine. Upper chest: Biapical scarring.  No acute findings Other: None IMPRESSION: Diffuse advanced degenerative disc and facet disease. No acute bony abnormality. Electronically Signed   By: Charlett NoseKevin  Dover M.D.   On: 12/15/2021 01:21   CT Head Wo Contrast  Result Date: 12/15/2021 CLINICAL DATA:  Fall EXAM: CT HEAD WITHOUT CONTRAST TECHNIQUE: Contiguous axial images were obtained from the base of the skull through the vertex without intravenous contrast. RADIATION DOSE REDUCTION: This exam was performed according to the departmental dose-optimization program which includes automated exposure control, adjustment of the mA and/or kV according to patient size and/or use of iterative reconstruction technique. COMPARISON:  None Available. FINDINGS: Brain: There is atrophy and chronic small vessel disease changes. No acute intracranial  abnormality. Specifically, no hemorrhage, hydrocephalus, mass lesion, acute infarction, or significant intracranial injury. Vascular: No hyperdense vessel or unexpected calcification. Skull: No acute calvarial abnormality. Sinuses/Orbits: No acute findings Other: None IMPRESSION: Atrophy, chronic microvascular disease. No acute intracranial abnormality. Electronically Signed   By: Charlett NoseKevin  Dover M.D.   On: 12/15/2021 01:19   DG Hip Unilat W or Wo Pelvis 2-3 Views Left  Result Date: 12/15/2021 CLINICAL DATA:  Fall, left hip pain, deformity EXAM: DG HIP (WITH OR WITHOUT PELVIS) 2-3V LEFT COMPARISON:  None FINDINGS: There is a left femoral intertrochanteric fracture. Varus angulation. No subluxation or dislocation. Hip joints and SI joints symmetric. Aortic atherosclerosis. IMPRESSION: Left femoral intertrochanteric fracture with varus angulation. Electronically Signed   By: Charlett NoseKevin  Dover M.D.   On: 12/15/2021 00:40   DG Chest 1 View  Result Date: 12/15/2021 CLINICAL DATA:  Fall, hip fracture, preop EXAM: CHEST  1 VIEW COMPARISON:  None Available. FINDINGS: Heart is normal size. Right base atelectasis. Left lung clear. No effusions or pneumothorax. No acute bony abnormality. IMPRESSION: Right base atelectasis. Electronically Signed   By: Charlett NoseKevin  Dover M.D.   On: 12/15/2021 00:39    Labs: BNP (last 3 results) No results for input(s): "BNP" in the last 8760 hours. Basic Metabolic Panel: Recent Labs  Lab 12/15/21 0035 12/16/21 0325 12/17/21 0334  NA 139 137 140  K 4.6 4.0 4.5  CL 106 106 107  CO2  --  22 27  GLUCOSE 129* 176* 115*  BUN 40* 20 26*  CREATININE 1.20* 0.72 0.90  CALCIUM  --  8.5* 8.9   Liver Function Tests: No results for input(s): "AST", "ALT", "ALKPHOS", "BILITOT", "PROT", "ALBUMIN" in the last 168 hours. No results for input(s): "LIPASE", "AMYLASE" in the last 168 hours. No results for input(s): "AMMONIA" in the last 168 hours. CBC: Recent Labs  Lab 12/15/21 0014  12/15/21 0035 12/16/21 0325 12/17/21 0334 12/18/21 0344  WBC 10.7*  --  9.0 7.7 7.5  NEUTROABS 9.3*  --   --   --   --   HGB 9.3* 9.9* 7.7* 7.3* 9.0*  HCT 28.3* 29.0* 23.8* 21.9* 27.0*  MCV 94.3  --  94.8 92.8 91.5  PLT 229  --  185 202 205   Cardiac Enzymes: No results for input(s): "CKTOTAL", "CKMB", "CKMBINDEX", "TROPONINI" in the last 168 hours. BNP: Invalid input(s): "POCBNP" CBG: No  results for input(s): "GLUCAP" in the last 168 hours. D-Dimer No results for input(s): "DDIMER" in the last 72 hours. Hgb A1c No results for input(s): "HGBA1C" in the last 72 hours. Lipid Profile No results for input(s): "CHOL", "HDL", "LDLCALC", "TRIG", "CHOLHDL", "LDLDIRECT" in the last 72 hours. Thyroid function studies No results for input(s): "TSH", "T4TOTAL", "T3FREE", "THYROIDAB" in the last 72 hours.  Invalid input(s): "FREET3" Anemia work up Recent Labs    12/16/21 0325  TIBC 309  IRON 22*   Urinalysis    Component Value Date/Time   COLORURINE YELLOW 12/16/2021 1543   APPEARANCEUR CLEAR 12/16/2021 1543   LABSPEC 1.018 12/16/2021 1543   PHURINE 5.0 12/16/2021 1543   GLUCOSEU 50 (A) 12/16/2021 1543   HGBUR NEGATIVE 12/16/2021 1543   BILIRUBINUR NEGATIVE 12/16/2021 1543   KETONESUR 5 (A) 12/16/2021 1543   PROTEINUR NEGATIVE 12/16/2021 1543   NITRITE NEGATIVE 12/16/2021 1543   LEUKOCYTESUR NEGATIVE 12/16/2021 1543   Sepsis Labs Recent Labs  Lab 12/15/21 0014 12/16/21 0325 12/17/21 0334 12/18/21 0344  WBC 10.7* 9.0 7.7 7.5   Microbiology Recent Results (from the past 240 hour(s))  Surgical pcr screen     Status: None   Collection Time: 12/15/21  3:39 AM   Specimen: Nasal Mucosa; Nasal Swab  Result Value Ref Range Status   MRSA, PCR NEGATIVE NEGATIVE Final   Staphylococcus aureus NEGATIVE NEGATIVE Final    Comment: (NOTE) The Xpert SA Assay (FDA approved for NASAL specimens in patients 45 years of age and older), is one component of a  comprehensive surveillance program. It is not intended to diagnose infection nor to guide or monitor treatment. Performed at [facility], [address], [place], [place] [address]      Time coordinating discharge: 35 minutes  SIGNED: Lanae Boast, MD  [facility] 12/18/2021, 9:38 AM  If 7PM-7AM, please contact night-coverage [email or web address]

## 2021-12-31 ENCOUNTER — Other Ambulatory Visit: Payer: Self-pay | Admitting: Orthopedic Surgery

## 2021-12-31 DIAGNOSIS — M542 Cervicalgia: Secondary | ICD-10-CM

## 2022-01-04 ENCOUNTER — Telehealth: Payer: Self-pay | Admitting: *Deleted

## 2022-01-04 NOTE — Telephone Encounter (Signed)
Ok to approve 

## 2022-01-04 NOTE — Telephone Encounter (Signed)
LMOM to return call.

## 2022-01-04 NOTE — Telephone Encounter (Signed)
Anna Ward with [facility] called requesting Verbal orders for OT 1x2weeks and an order for Social Worker Evaluate and Treat.   Please Advise.

## 2022-01-05 ENCOUNTER — Telehealth: Payer: Self-pay

## 2022-01-05 NOTE — Telephone Encounter (Signed)
Notified and agreed.  

## 2022-01-05 NOTE — Telephone Encounter (Signed)
Patient called to check on the referral status for his mother at home PT. I stated that is was being worked on and that someone should be contacting him soon since [place] approved it yesterday afternoon

## 2022-01-12 ENCOUNTER — Ambulatory Visit
Admission: RE | Admit: 2022-01-12 | Discharge: 2022-01-12 | Disposition: A | Discharge status: Home / Self Care | Payer: Medicare Other | Source: Ambulatory Visit | Attending: Orthopedic Surgery | Admitting: Orthopedic Surgery

## 2022-01-12 DIAGNOSIS — M542 Cervicalgia: Secondary | ICD-10-CM

## 2022-12-06 ENCOUNTER — Telehealth: Payer: Medicare Other | Admitting: Nurse Practitioner

## 2022-12-06 NOTE — Telephone Encounter (Signed)
Left voice message with patient's son, Greig Castilla, regarding status of his mother's appointments or if she is still a patient in our office. She established as a new patient in 2022 but has not been seen since.

## 2023-08-20 IMAGING — CT CT RENAL STONE PROTOCOL
3 of 8 series · 15 of 46 positions shown, 17 images · non-contrast
Comparison: None.

CLINICAL DATA: Flank pain, bilateral low back pain status post fall

EXAM:
CT ABDOMEN AND PELVIS WITHOUT CONTRAST
TECHNIQUE: Multidetector CT imaging of the abdomen and pelvis was performed
following the standard protocol without IV contrast.

[Series 2: axial st · axial · 0.68mm/px · z∈[+979,+1294]mm · 11 of 77 slices shown, 13 images]
[im 7/77  soft-tissue]
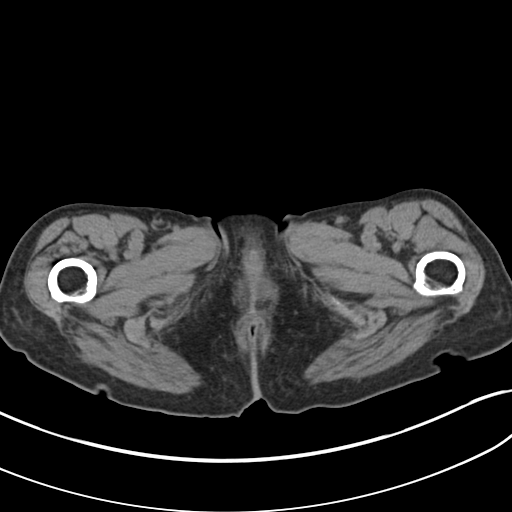
[im 7/77  bone]
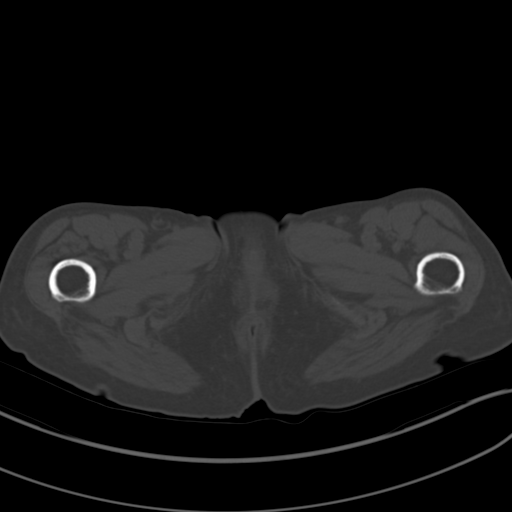
[im 13/77  soft-tissue]
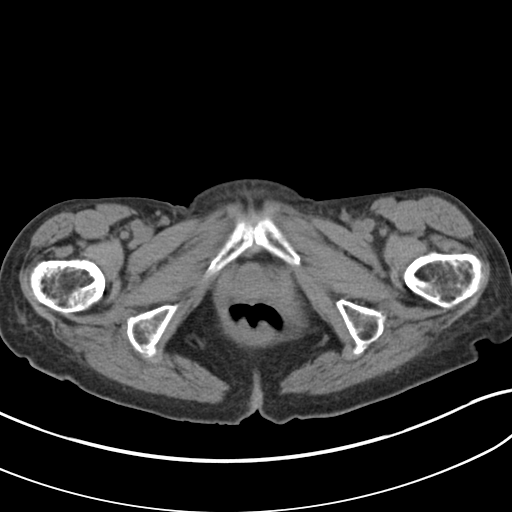
[im 20/77  soft-tissue]
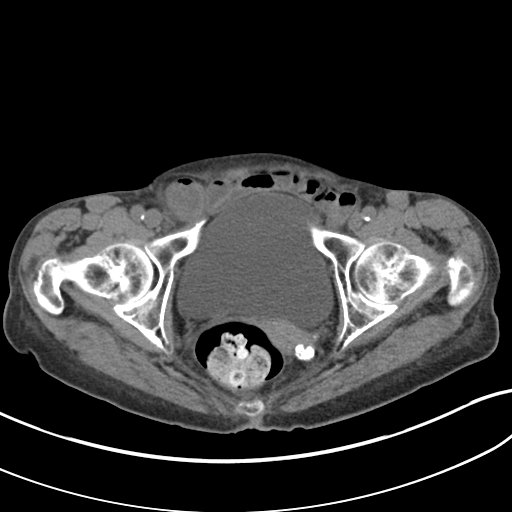
[im 26/77  soft-tissue]
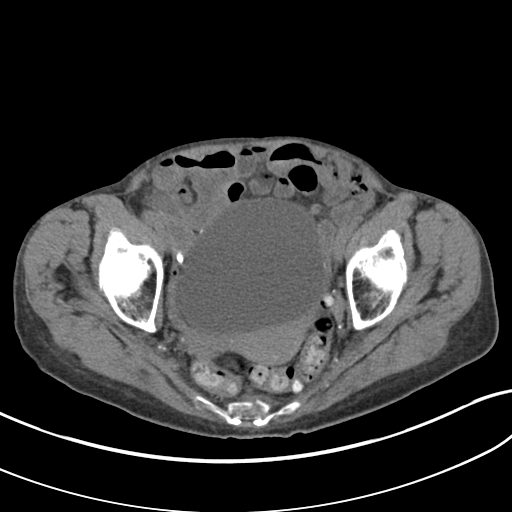
[im 32/77  soft-tissue]
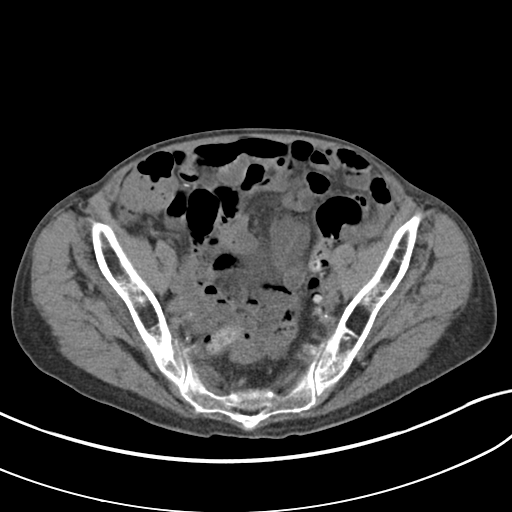
[im 39/77  soft-tissue]
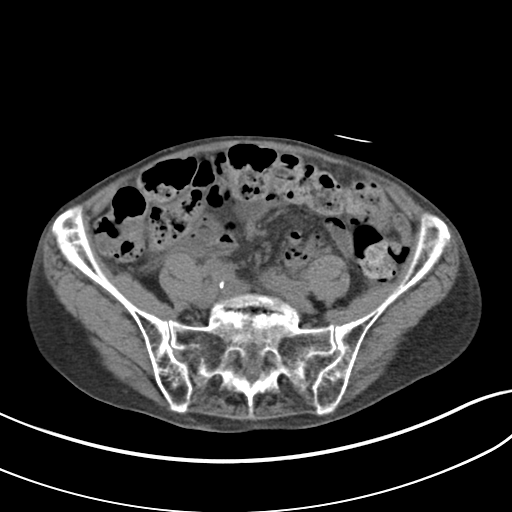
[im 45/77  soft-tissue]
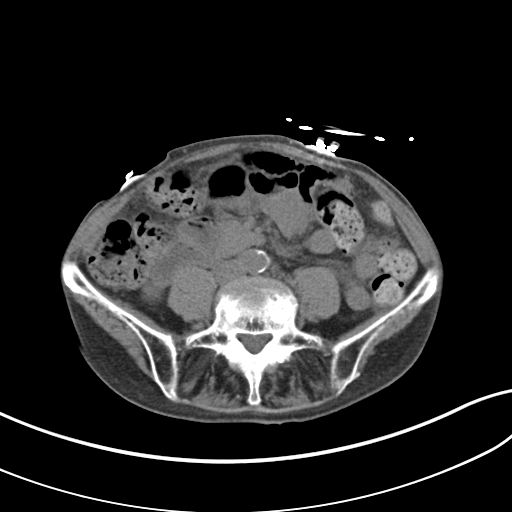
[im 51/77  soft-tissue]
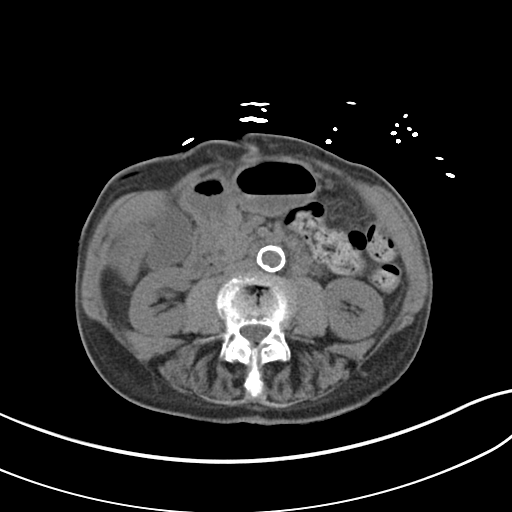
[im 58/77  soft-tissue]
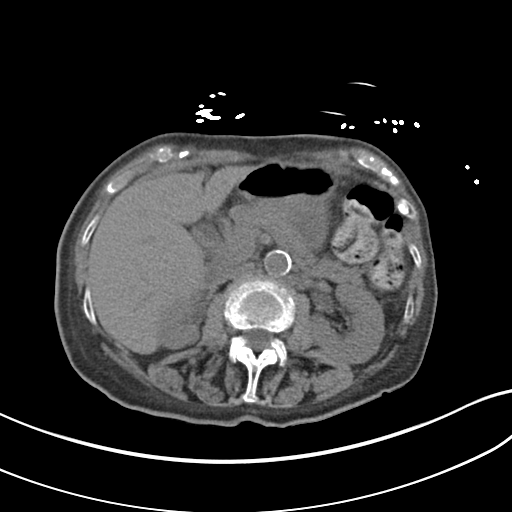
[im 58/77  bone]
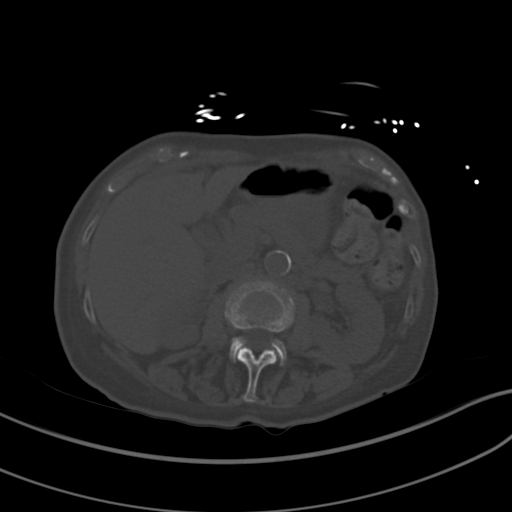
[im 64/77  soft-tissue]
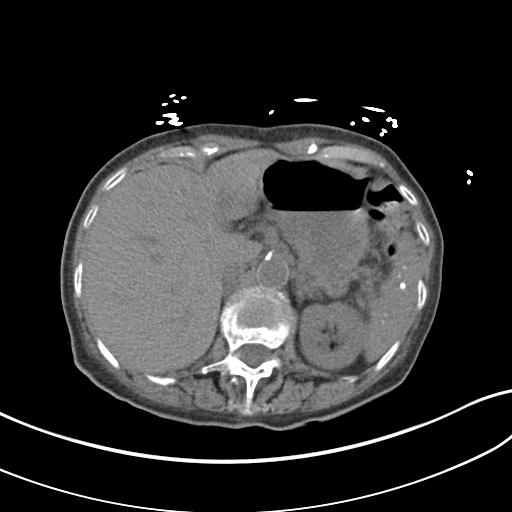
[im 70/77  soft-tissue]
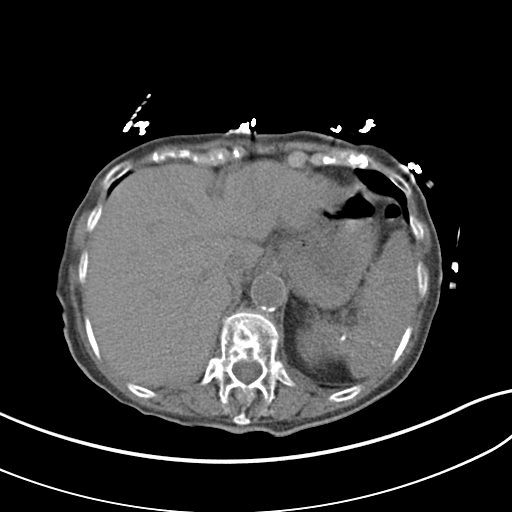

[Series 5: coronal · coronal · 0.64mm/px · 3 of 110 slices shown]
[im 28/110  soft-tissue]
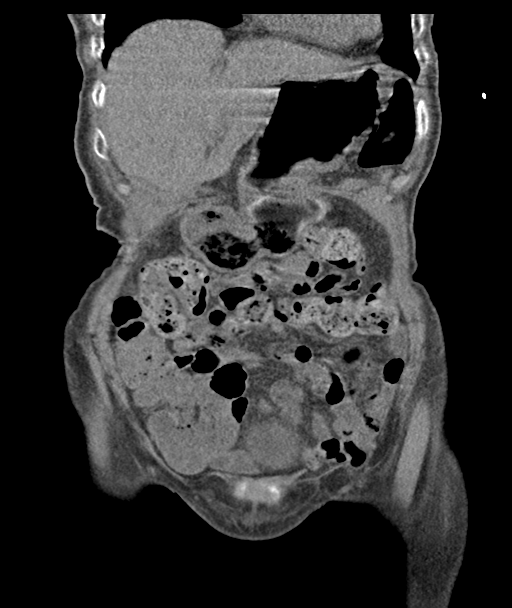
[im 55/110  soft-tissue]
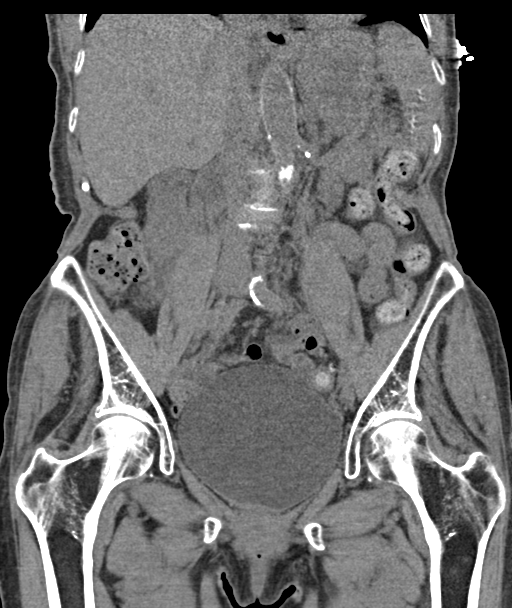
[im 82/110  soft-tissue]
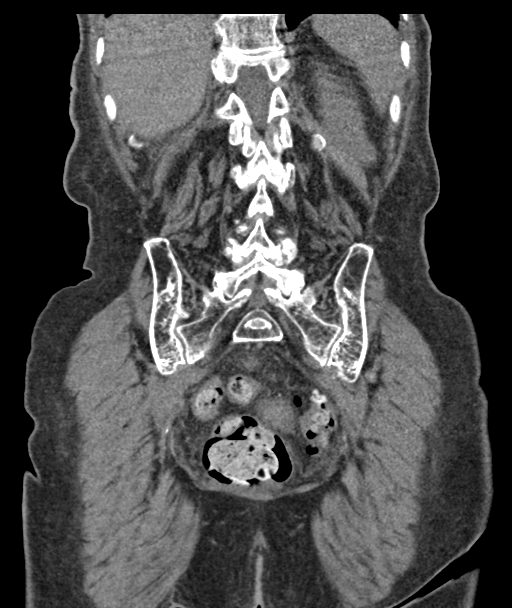

[Series 7: lung bases · axial · 0.68mm/px · 1 of 33 slices shown]
[im 7/33  bone]
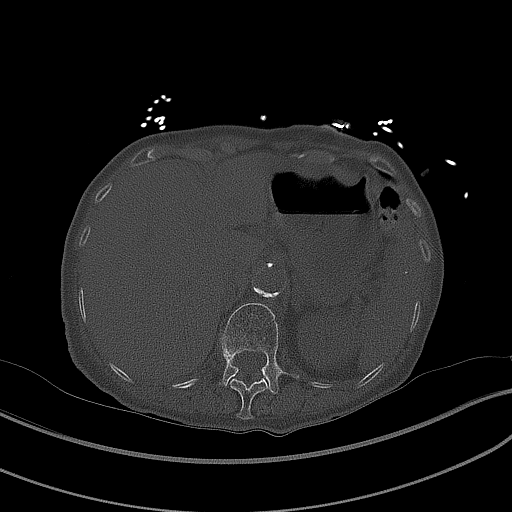

[15 of 46 positions shown; findings below may reference images not displayed]

FINDINGS: Lower chest: No acute abnormality.

Hepatobiliary: No focal liver abnormality is seen. No gallstones,
gallbladder wall thickening, or biliary dilatation.

Pancreas: Unremarkable. No pancreatic ductal dilatation or
surrounding inflammatory changes.

Spleen: No splenic mass. Dystrophic calcifications within the spleen
likely reflecting prior granulomatous disease.

Adrenals/Urinary Tract: Normal adrenal glands. 3 mm nonobstructing
right renal calculus. No other nephrolithiasis. No ureterolithiasis.
No renal mass or obstructive uropathy. Distended bladder.

Stomach/Bowel: Stomach is within normal limits. No evidence of bowel
wall thickening, distention, or inflammatory changes. Small hiatal
hernia. Diverticulosis without evidence of diverticulitis.

Vascular/Lymphatic: Normal caliber abdominal aorta with mild
atherosclerosis. No lymphadenopathy.

Reproductive: Uterus and bilateral adnexa are unremarkable.

Other: No abdominal wall hernia or abnormality. No abdominopelvic
ascites.

Musculoskeletal: Generalized osteopenia. No aggressive osseous
lesion.

Age indeterminate comminuted T12 vertebral body compression fracture
with approximately 90% central height loss and 2 mm of retropulsion
of the superior posterior margin. Age-indeterminate L1 vertebral
body compression fracture with approximately 50% central height loss
and a subtle linear lucency (image 82/series 6) concerning for an
acute fracture. Age-indeterminate L3 vertebral body compression
fracture with approximately 30% height loss. Age-indeterminate L4
vertebral body compression fracture with approximately 60% central
height loss.

Degenerative disease with disc height loss at T11-12, T12-L1, L3-4
and L5-S1. Severe spinal stenosis at L4-5. Severe bilateral facet
arthropathy at L4-5 and L5-S1. Bilateral foraminal stenosis at
L5-S1.

Nondisplaced lower sacral fracture involving the S4 vertebral body.
IMPRESSION: 1. Nonobstructing 3 mm right renal calculus.
2. Age-indeterminate L1 vertebral body compression fracture with
approximately 50% central height loss and a subtle linear lucency
(image 82/series 6) concerning for an acute fracture.
3. Age-indeterminate compression fractures of the T12, L3 and L4
vertebral bodies.
4. Acute nondisplaced lower sacral fracture involving the S4
vertebral body.
5.  Aortic Atherosclerosis ([id number]).

## 2023-08-20 IMAGING — CT CT L SPINE W/O CM
3 of 4 series · 12 of 33 positions shown, 14 images · IV contrast (agent unspecified)
Comparison: None

CLINICAL DATA: Bilateral low back pain, recent fall

EXAM:
CT Lumbar Spine without contrast
TECHNIQUE: 
TECHNIQUE: Multiplanar CT images of the lumbar spine were
reconstructed from contemporary CT of the Abdomen and Pelvis.
CONTRAST:  None

[Series 7: axial st lspine · axial · 0.29mm/px · z∈[+1078,+1284]mm · 4 of 276 slices shown, 5 images]
[im 35/276  soft-tissue]
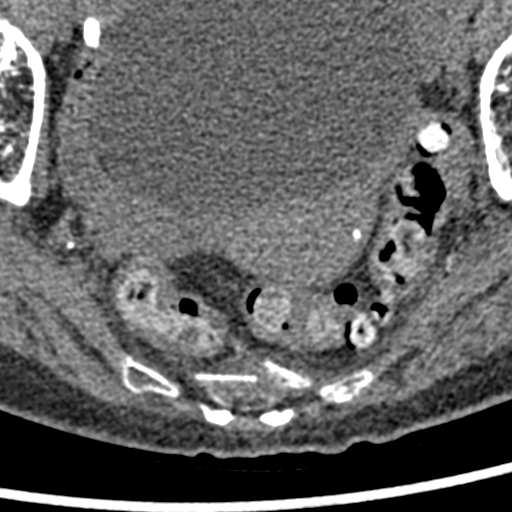
[im 35/276  bone]
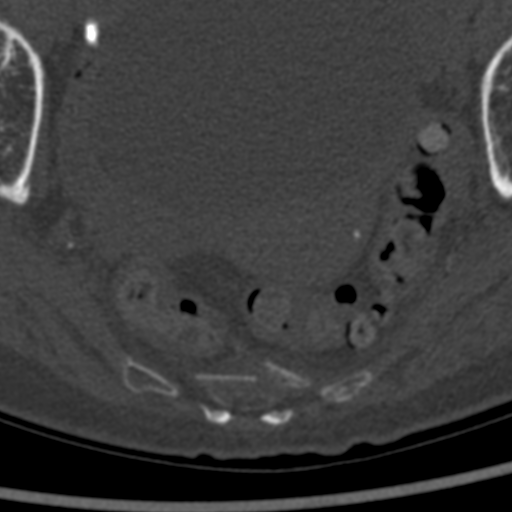
[im 104/276  bone]
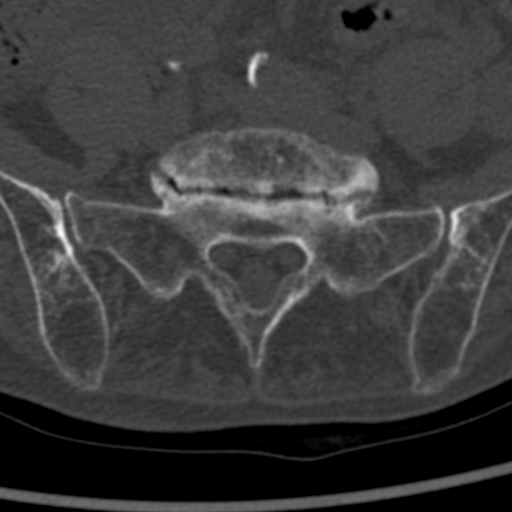
[im 172/276  bone]
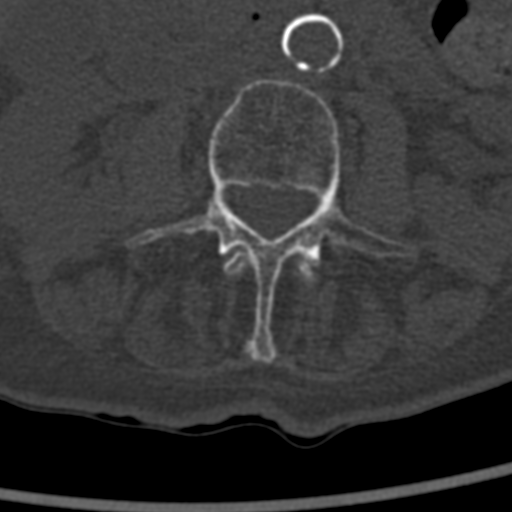
[im 241/276  bone]
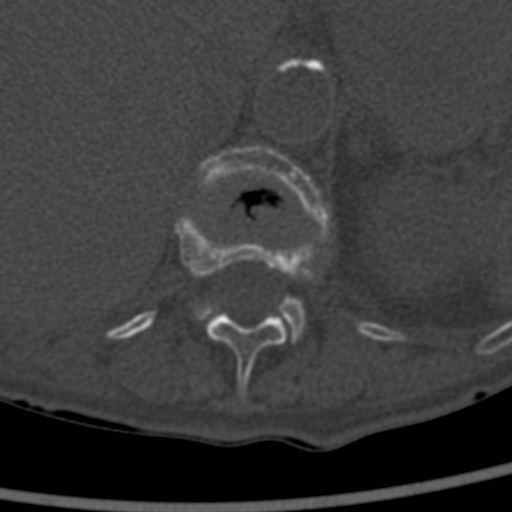

[Series 9: cor axial st lspine · coronal · 0.31mm/px · 3 of 78 slices shown]
[im 16/78  bone]
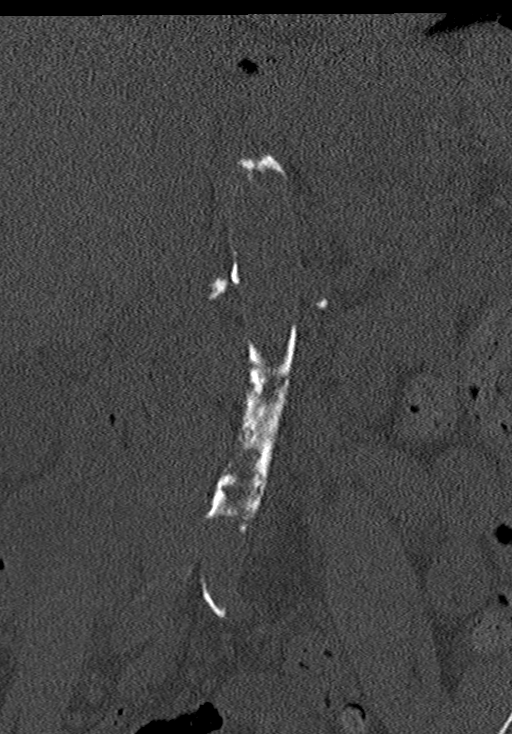
[im 31/78  bone]
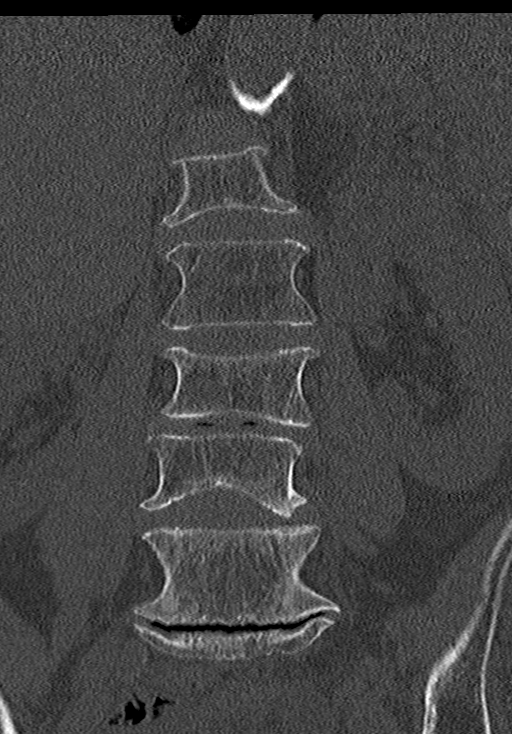
[im 47/78  bone]
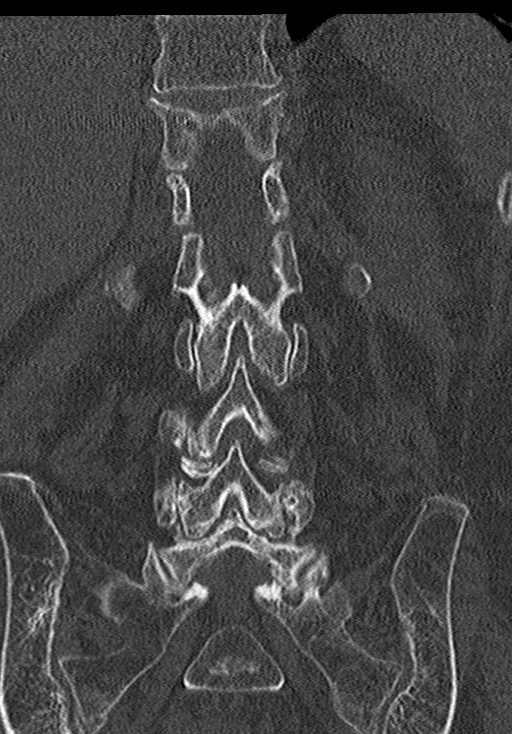

[Series 11: sag st lspine · sagittal · 0.28mm/px · 5 of 80 slices shown, 6 images]
[im 27/80  bone]
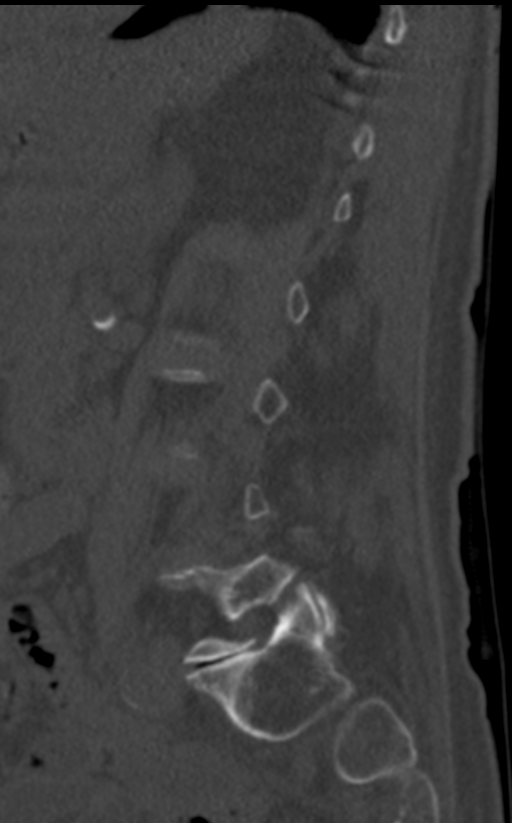
[im 33/80  bone]
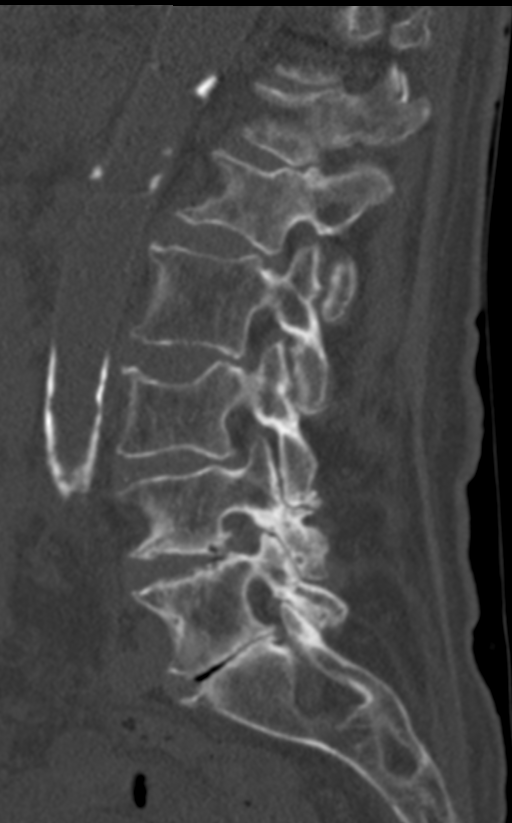
[im 40/80  soft-tissue]
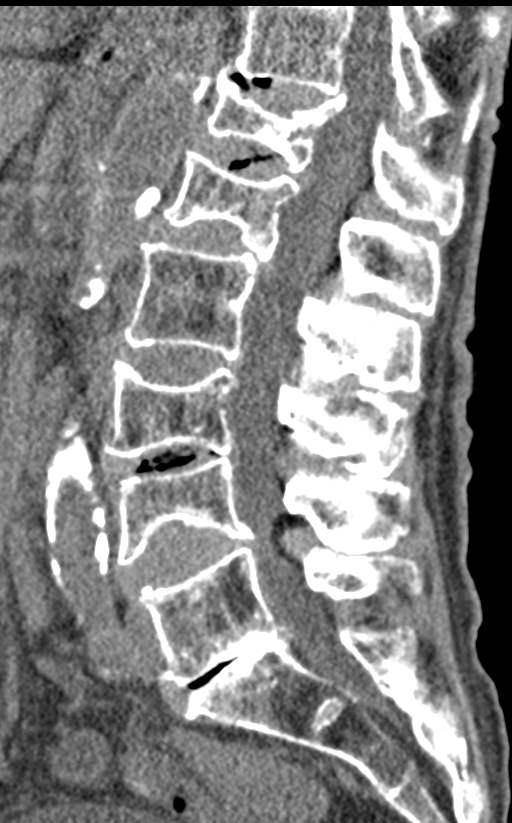
[im 40/80  bone]
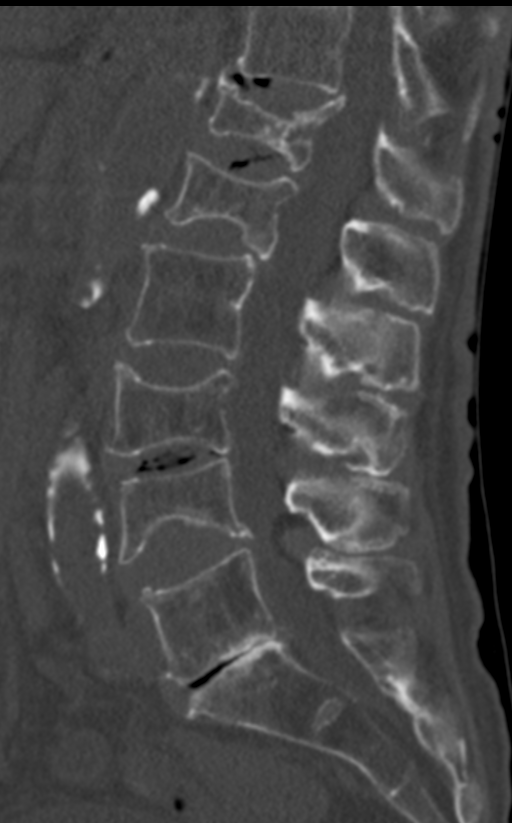
[im 47/80  bone]
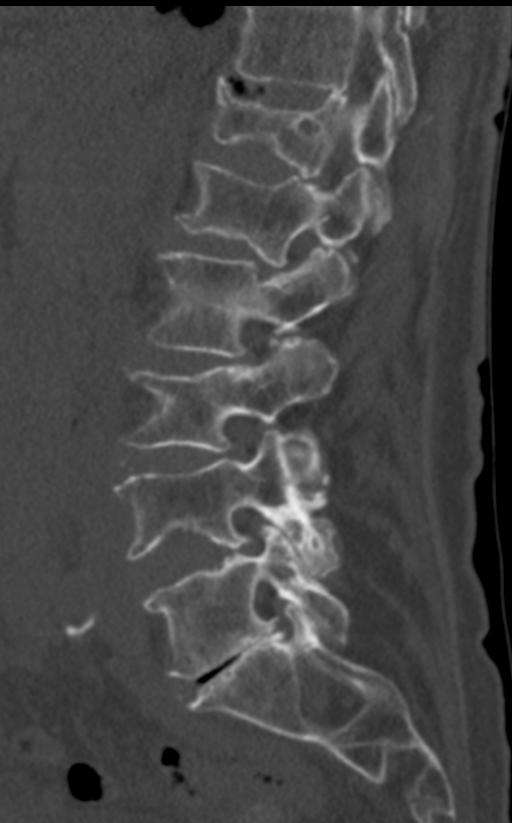
[im 53/80  bone]
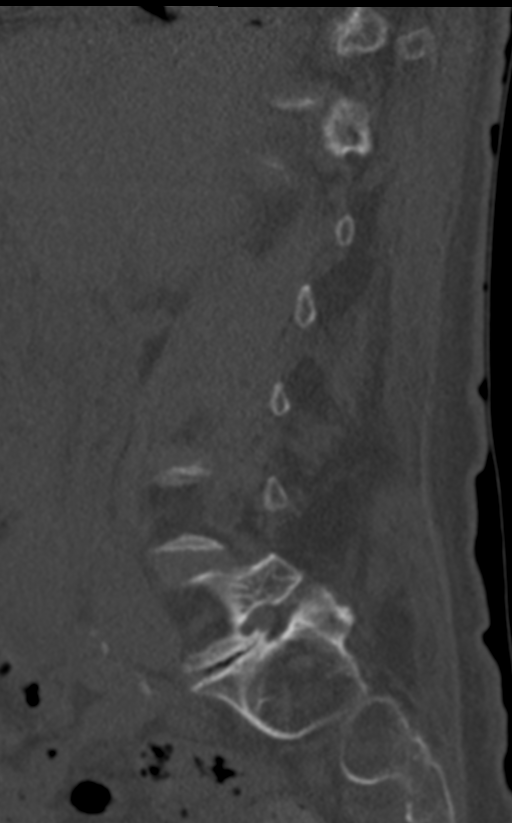

[12 of 33 positions shown; findings below may reference images not displayed]

FINDINGS: Segmentation: 5 lumbar type vertebrae.

Alignment: Mild retrolisthesis at L1-L2.

Vertebrae: Compression fractures are present at T12, L1, L3, and L4.
These are age-indeterminate but favored to be non-acute. Height loss
is greatest at T12. There is mild osseous retropulsion, greatest at
T12 and L1. Decreased osseous mineralization.

Paraspinal and other soft tissues: Unremarkable.

Disc levels: Multilevel degenerative changes are present with disc
space narrowing, endplate osteophytes, and facet hypertrophy with
ligamentum flavum thickening. Mild canal stenosis at T11-T12
secondary to endplate retropulsion and disc bulge with facet
hypertrophy. Marked canal stenosis at L4-L5 where there is a disc
bulge, endplate osteophytic ridging, and facet hypertrophy with
ligamentum flavum thickening. Multilevel foraminal narrowing is
greatest on the left at L4-L5.
IMPRESSION: Age-indeterminate but most likely chronic T12, L1, L3, and L4
compression fractures.

Multilevel degenerative changes. Canal and foraminal stenosis are
greatest at L4-L5.
